# Patient Record
Sex: Female | Born: 1975
Health system: Southern US, Community
[De-identification: ages and names within clinical notes are randomized; demographics above are authoritative.]

## PROBLEM LIST (undated history)

## (undated) DIAGNOSIS — I10 Essential (primary) hypertension: Secondary | ICD-10-CM

## (undated) DIAGNOSIS — M1990 Unspecified osteoarthritis, unspecified site: Secondary | ICD-10-CM

## (undated) DIAGNOSIS — D649 Anemia, unspecified: Secondary | ICD-10-CM

## (undated) DIAGNOSIS — J189 Pneumonia, unspecified organism: Secondary | ICD-10-CM

## (undated) DIAGNOSIS — I499 Cardiac arrhythmia, unspecified: Secondary | ICD-10-CM

## (undated) DIAGNOSIS — K859 Acute pancreatitis without necrosis or infection, unspecified: Secondary | ICD-10-CM

## (undated) DIAGNOSIS — K219 Gastro-esophageal reflux disease without esophagitis: Secondary | ICD-10-CM

## (undated) DIAGNOSIS — F419 Anxiety disorder, unspecified: Secondary | ICD-10-CM

## (undated) HISTORY — PX: FRACTURE SURGERY: SHX138

## (undated) HISTORY — DX: Anxiety disorder, unspecified: F41.9

## (undated) HISTORY — PX: WISDOM TOOTH EXTRACTION: SHX21

## (undated) HISTORY — PX: BREAST ENHANCEMENT SURGERY: SHX7

## (undated) HISTORY — DX: Gastro-esophageal reflux disease without esophagitis: K21.9

---

## 2009-11-04 ENCOUNTER — Emergency Department: Payer: Self-pay | Admitting: Emergency Medicine

## 2016-10-09 DIAGNOSIS — Z13228 Encounter for screening for other metabolic disorders: Secondary | ICD-10-CM | POA: Diagnosis not present

## 2016-10-09 DIAGNOSIS — Z131 Encounter for screening for diabetes mellitus: Secondary | ICD-10-CM | POA: Diagnosis not present

## 2016-10-09 DIAGNOSIS — Z13220 Encounter for screening for lipoid disorders: Secondary | ICD-10-CM | POA: Diagnosis not present

## 2016-10-09 DIAGNOSIS — Z01419 Encounter for gynecological examination (general) (routine) without abnormal findings: Secondary | ICD-10-CM | POA: Diagnosis not present

## 2016-10-09 DIAGNOSIS — Z6822 Body mass index (BMI) 22.0-22.9, adult: Secondary | ICD-10-CM | POA: Diagnosis not present

## 2017-03-05 DIAGNOSIS — M4012 Other secondary kyphosis, cervical region: Secondary | ICD-10-CM | POA: Diagnosis not present

## 2017-03-05 DIAGNOSIS — M50322 Other cervical disc degeneration at C5-C6 level: Secondary | ICD-10-CM | POA: Diagnosis not present

## 2017-03-05 DIAGNOSIS — M9902 Segmental and somatic dysfunction of thoracic region: Secondary | ICD-10-CM | POA: Diagnosis not present

## 2017-03-05 DIAGNOSIS — M9901 Segmental and somatic dysfunction of cervical region: Secondary | ICD-10-CM | POA: Diagnosis not present

## 2017-03-18 DIAGNOSIS — D225 Melanocytic nevi of trunk: Secondary | ICD-10-CM | POA: Diagnosis not present

## 2017-03-18 DIAGNOSIS — L821 Other seborrheic keratosis: Secondary | ICD-10-CM | POA: Diagnosis not present

## 2017-03-18 DIAGNOSIS — D485 Neoplasm of uncertain behavior of skin: Secondary | ICD-10-CM | POA: Diagnosis not present

## 2017-10-23 DIAGNOSIS — Z01419 Encounter for gynecological examination (general) (routine) without abnormal findings: Secondary | ICD-10-CM | POA: Diagnosis not present

## 2017-10-23 DIAGNOSIS — Z1231 Encounter for screening mammogram for malignant neoplasm of breast: Secondary | ICD-10-CM | POA: Diagnosis not present

## 2018-06-23 DIAGNOSIS — D225 Melanocytic nevi of trunk: Secondary | ICD-10-CM | POA: Diagnosis not present

## 2018-06-23 DIAGNOSIS — D485 Neoplasm of uncertain behavior of skin: Secondary | ICD-10-CM | POA: Diagnosis not present

## 2018-06-23 DIAGNOSIS — L821 Other seborrheic keratosis: Secondary | ICD-10-CM | POA: Diagnosis not present

## 2018-09-10 DIAGNOSIS — H524 Presbyopia: Secondary | ICD-10-CM | POA: Diagnosis not present

## 2018-09-17 ENCOUNTER — Ambulatory Visit: Payer: Self-pay | Admitting: Family Medicine

## 2018-09-17 ENCOUNTER — Encounter: Payer: Self-pay | Admitting: Family Medicine

## 2018-09-17 ENCOUNTER — Ambulatory Visit: Payer: BLUE CROSS/BLUE SHIELD | Admitting: Family Medicine

## 2018-09-17 VITALS — BP 112/62 | HR 81 | Temp 97.7°F | Ht 64.5 in | Wt 127.8 lb

## 2018-09-17 DIAGNOSIS — Z720 Tobacco use: Secondary | ICD-10-CM

## 2018-09-17 DIAGNOSIS — R002 Palpitations: Secondary | ICD-10-CM

## 2018-09-17 DIAGNOSIS — Z8249 Family history of ischemic heart disease and other diseases of the circulatory system: Secondary | ICD-10-CM | POA: Diagnosis not present

## 2018-09-17 DIAGNOSIS — R7989 Other specified abnormal findings of blood chemistry: Secondary | ICD-10-CM

## 2018-09-17 DIAGNOSIS — R945 Abnormal results of liver function studies: Secondary | ICD-10-CM

## 2018-09-17 DIAGNOSIS — Z23 Encounter for immunization: Secondary | ICD-10-CM

## 2018-09-17 DIAGNOSIS — Z1239 Encounter for other screening for malignant neoplasm of breast: Secondary | ICD-10-CM | POA: Diagnosis not present

## 2018-09-17 DIAGNOSIS — F419 Anxiety disorder, unspecified: Secondary | ICD-10-CM | POA: Insufficient documentation

## 2018-09-17 LAB — LIPID PANEL
Cholesterol: 226 mg/dL — ABNORMAL HIGH (ref 0–200)
HDL: 104.4 mg/dL (ref >39.00)
LDL Cholesterol: 102 mg/dL — ABNORMAL HIGH (ref 0–99)
NonHDL: 121.65
Total CHOL/HDL Ratio: 2
Triglycerides: 98 mg/dL (ref 0.0–149.0)
VLDL: 19.6 mg/dL (ref 0.0–40.0)

## 2018-09-17 LAB — CBC WITH DIFFERENTIAL/PLATELET
Basophils Absolute: 0 10*3/uL (ref 0.0–0.1)
Basophils Relative: 0.7 % (ref 0.0–3.0)
Eosinophils Absolute: 0.1 10*3/uL (ref 0.0–0.7)
Eosinophils Relative: 2.1 % (ref 0.0–5.0)
HCT: 42.5 % (ref 36.0–46.0)
Hemoglobin: 14.6 g/dL (ref 12.0–15.0)
Lymphocytes Relative: 48.5 % — ABNORMAL HIGH (ref 12.0–46.0)
Lymphs Abs: 2.9 10*3/uL (ref 0.7–4.0)
MCHC: 34.5 g/dL (ref 30.0–36.0)
MCV: 104.9 fl — ABNORMAL HIGH (ref 78.0–100.0)
Monocytes Absolute: 0.5 10*3/uL (ref 0.1–1.0)
Monocytes Relative: 8 % (ref 3.0–12.0)
Neutro Abs: 2.4 10*3/uL (ref 1.4–7.7)
Neutrophils Relative %: 40.7 % — ABNORMAL LOW (ref 43.0–77.0)
Platelets: 154 10*3/uL (ref 150.0–400.0)
RBC: 4.05 Mil/uL (ref 3.87–5.11)
RDW: 13.4 % (ref 11.5–15.5)
WBC: 6 10*3/uL (ref 4.0–10.5)

## 2018-09-17 LAB — COMPREHENSIVE METABOLIC PANEL
ALT: 208 U/L — ABNORMAL HIGH (ref 0–35)
AST: 304 U/L — ABNORMAL HIGH (ref 0–37)
Albumin: 4.9 g/dL (ref 3.5–5.2)
Alkaline Phosphatase: 102 U/L (ref 39–117)
BUN: 9 mg/dL (ref 6–23)
CO2: 30 mEq/L (ref 19–32)
Calcium: 10 mg/dL (ref 8.4–10.5)
Chloride: 101 mEq/L (ref 96–112)
Creatinine, Ser: 0.72 mg/dL (ref 0.40–1.20)
GFR: 93.51 mL/min (ref >60.00)
Glucose, Bld: 97 mg/dL (ref 70–99)
Potassium: 3.8 mEq/L (ref 3.5–5.1)
Sodium: 140 mEq/L (ref 135–145)
Total Bilirubin: 0.5 mg/dL (ref 0.2–1.2)
Total Protein: 7.6 g/dL (ref 6.0–8.3)

## 2018-09-17 LAB — TSH: TSH: 3.24 u[IU]/mL (ref 0.35–4.50)

## 2018-09-17 MED ORDER — ALPRAZOLAM 0.25 MG PO TABS: 0.2500 mg | ORAL_TABLET | Freq: Two times a day (BID) | ORAL | 1 refills | Status: DC | PRN

## 2018-09-17 MED ORDER — BUPROPION HCL ER (SR) 150 MG PO TB12: 150.0000 mg | ORAL_TABLET | Freq: Two times a day (BID) | ORAL | 2 refills | Status: DC

## 2018-09-17 NOTE — Patient Instructions (Addendum)
Please call and schedule an appointment for screening mammogram.  [phone]  Please stop at the front desk to see one of the referrals coordinators  Bupropion sustained-release tablets (smoking cessation) What is this medicine? BUPROPION (byoo PROE pee on) is used to help people quit smoking. This medicine may be used for other purposes; ask your health care provider or pharmacist if you have questions. COMMON BRAND NAME(S): Buproban, Zyban What should I tell my health care provider before I take this medicine? They need to know if you have any of these conditions: -an eating disorder, such as anorexia or bulimia -bipolar disorder or psychosis -diabetes or high blood sugar, treated with medication -glaucoma -head injury or brain tumor -heart disease, previous heart attack, or irregular heart beat -high blood pressure -kidney or liver disease -seizures -suicidal thoughts or a previous suicide attempt -Tourette's syndrome -weight loss -an unusual or allergic reaction to bupropion, other medicines, foods, dyes, or preservatives -breast-feeding -pregnant or trying to become pregnant How should I use this medicine? Take this medicine by mouth with a glass of water. Follow the directions on the prescription label. You can take it with or without food. If it upsets your stomach, take it with food. Do not cut, crush or chew this medicine. Take your medicine at regular intervals. If you take this medicine more than once a day, take your second dose at least 8 hours after you take your first dose. To limit difficulty in sleeping, avoid taking this medicine at bedtime. Do not take your medicine more often than directed. Do not stop taking this medicine suddenly except upon the advice of your doctor. Stopping this medicine too quickly may cause serious side effects. A special MedGuide will be given to you by the pharmacist with each prescription and refill. Be  sure to read this information carefully each time. Talk to your pediatrician regarding the use of this medicine in children. Special care may be needed. Overdosage: If you think you have taken too much of this medicine contact a poison control center or emergency room at once. NOTE: This medicine is only for you. Do not share this medicine with others. What if I miss a dose? If you miss a dose, skip the missed dose and take your next tablet at the regular time. There should be at least 8 hours between doses. Do not take double or extra doses. What may interact with this medicine? Do not take this medicine with any of the following medications: -linezolid -MAOIs like Azilect, Carbex, Eldepryl, Marplan, Nardil, and Parnate -methylene blue (injected into a vein) -other medicines that contain bupropion like Wellbutrin This medicine may also interact with the following medications: -alcohol -certain medicines for anxiety or sleep -certain medicines for blood pressure like metoprolol, propranolol -certain medicines for depression or psychotic disturbances -certain medicines for HIV or AIDS like efavirenz, lopinavir, nelfinavir, ritonavir -certain medicines for irregular heart beat like propafenone, flecainide -certain medicines for Parkinson's disease like amantadine, levodopa -certain medicines for seizures like carbamazepine, phenytoin, phenobarbital -cimetidine -clopidogrel -cyclophosphamide -digoxin -furazolidone -isoniazid -nicotine -orphenadrine -procarbazine -steroid medicines like prednisone or cortisone -stimulant medicines for attention disorders, weight loss, or to stay awake -tamoxifen -theophylline -thiotepa -ticlopidine -tramadol -warfarin This list may not describe all possible interactions. Give your health care provider a list of all the medicines, herbs, non-prescription drugs, or dietary supplements you use. Also tell them if you smoke, drink alcohol, or use illegal  drugs. Some items may interact with your medicine.  What should I watch for while using this medicine? Visit your doctor or health care professional for regular checks on your progress. This medicine should be used together with a patient support program. It is important to participate in a behavioral program, counseling, or other support program that is recommended by your health care professional. Patients and their families should watch out for new or worsening thoughts of suicide or depression. Also watch out for sudden changes in feelings such as feeling anxious, agitated, panicky, irritable, hostile, aggressive, impulsive, severely restless, overly excited and hyperactive, or not being able to sleep. If this happens, especially at the beginning of treatment or after a change in dose, call your health care professional. Avoid alcoholic drinks while taking this medicine. Drinking excessive alcoholic beverages, using sleeping or anxiety medicines, or quickly stopping the use of these agents while taking this medicine may increase your risk for a seizure. Do not drive or use heavy machinery until you know how this medicine affects you. This medicine can impair your ability to perform these tasks. Do not take this medicine close to bedtime. It may prevent you from sleeping. Your mouth may get dry. Chewing sugarless gum or sucking hard candy, and drinking plenty of water may help. Contact your doctor if the problem does not go away or is severe. Do not use nicotine patches or chewing gum without the advice of your doctor or health care professional while taking this medicine. You may need to have your blood pressure taken regularly if your doctor recommends that you use both nicotine and this medicine together. What side effects may I notice from receiving this medicine? Side effects that you should report to your doctor or health care professional as soon as possible: -allergic reactions like skin rash,  itching or hives, swelling of the face, lips, or tongue -breathing problems -changes in vision -confusion -elevated mood, decreased need for sleep, racing thoughts, impulsive behavior -fast or irregular heartbeat -hallucinations, loss of contact with reality -increased blood pressure -redness, blistering, peeling or loosening of the skin, including inside the mouth -seizures -suicidal thoughts or other mood changes -unusually weak or tired -vomiting Side effects that usually do not require medical attention (report to your doctor or health care professional if they continue or are bothersome): -constipation -headache -loss of appetite -nausea -tremors -weight loss This list may not describe all possible side effects. Call your doctor for medical advice about side effects. You may report side effects to FDA at 1-800-FDA-1088. Where should I keep my medicine? Keep out of the reach of children. Store at room temperature between 20 and 25 degrees C (68 and 77 degrees F). Protect from light. Keep container tightly closed. Throw away any unused medicine after the expiration date. NOTE: This sheet is a summary. It may not cover all possible information. If you have questions about this medicine, talk to your doctor, pharmacist, or health care provider.  2018 Elsevier/Gold Standard (2016-04-13 13:49:28)

## 2018-09-17 NOTE — Progress Notes (Signed)
Subjective:    Patient ID: Olivia Turner, female    DOB: 04/11/1976, 42 y.o.   MRN: [id number]  HPI This is a 42 yo female who presents today to establish care. She is accompanied by a friend who is also my patient. 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   Last CPE- gyn last year Mammo- 10/23/17 Pap- 10/23/17- negative, negative HPV Tdap- 07/24/11 Flu- today Eye- regular Dental- regular Exercise- not regular  Concerned about her heart. Over last 1.5 months she has noticed heart fluttering. Occurs between 10 pm- 4 am. Feels like an elephant is sitting on her chest. Has almost every night. Has SOB. Takes her xanax and after about 2 hours, it "calms down." Never occurs at other times. Never with physical exertion.  Smoker, 1 ppd x 25 years. Strong family history of CAD in father and brother.   Chronic headaches, gets migraines, Botox helps.     Past Medical History:  Diagnosis Date  . Anxiety   . GERD (gastroesophageal reflux disease)    History reviewed. No pertinent surgical history. Family History  Problem Relation Age of Onset  . Hyperlipidemia Mother   . Heart attack Father   . Hyperlipidemia Father   . Atrial fibrillation Brother   . Heart attack Maternal Uncle    Social History   Tobacco Use  . Smoking status: Current Every Day Smoker  . Smokeless tobacco: Never Used  Substance Use Topics  . Alcohol use: Yes    Alcohol/week: 3.0 standard drinks    Types: 3 Glasses of wine per week  . Drug use: Never      Review of Systems Per HPI    Objective:   Physical Exam  Constitutional: She is oriented to person, place, and time. She appears well-developed and well-nourished. No distress.  HENT:  Head: Normocephalic and atraumatic.  Mouth/Throat: Oropharynx is clear and moist.  Eyes: Conjunctivae are normal.  Neck: Normal range of motion. Neck supple.  Cardiovascular: Normal rate, regular  rhythm and normal heart sounds.  Pulmonary/Chest: Effort normal and breath sounds normal.  Musculoskeletal: She exhibits no edema.  Lymphadenopathy:    She has no cervical adenopathy.  Neurological: She is alert and oriented to person, place, and time.  Skin: Skin is warm and dry. She is not diaphoretic.  Psychiatric: She has a normal mood and affect. Her behavior is normal. Judgment and thought content normal.  Vitals reviewed.     BP 112/62 (BP Location: Right Arm, Patient Position: Sitting, Cuff Size: Normal)   Pulse 81   Temp 97.7 F (36.5 C) (Oral)   Ht 5' 4.5" (1.638 m)   Wt 127 lb 12.8 oz (58 kg)   SpO2 97%   BMI 21.60 kg/m   EKG- rate 82, PR 130, sinus rhythm, compared to 11/04/2009 tracing, no significant change    Assessment & Plan:  1. Palpitations - Lipid Panel - Comprehensive metabolic panel - TSH - CBC with Differential - Holter monitor - 48 hour; Future - EKG 12-Lead - Ambulatory referral to Cardiology  2. Screening for breast cancer - MM 3D SCREEN BREAST BILATERAL; Future  3. Family history of early CAD - Ambulatory referral to Cardiology  4. Tobacco abuse - encouraged smoking cessation - provided her a written prescription for bupropion at her request. She will not start until after palpitations worked up  5. Need for influenza vaccination - Flu Vaccine QUAD 36+ mos IM  Olean Ree, FNP-BC  [facility], [facility]  09/18/2018 3:45 PM

## 2018-09-19 ENCOUNTER — Other Ambulatory Visit: Payer: Self-pay | Admitting: Family Medicine

## 2018-09-19 ENCOUNTER — Encounter: Payer: Self-pay | Admitting: Family Medicine

## 2018-09-19 DIAGNOSIS — R945 Abnormal results of liver function studies: Secondary | ICD-10-CM

## 2018-09-19 MED ORDER — ALPRAZOLAM 0.25 MG PO TABS: 0.2500 mg | ORAL_TABLET | Freq: Two times a day (BID) | ORAL | 1 refills | Status: DC | PRN

## 2018-09-21 DIAGNOSIS — R079 Chest pain, unspecified: Secondary | ICD-10-CM | POA: Insufficient documentation

## 2018-09-21 DIAGNOSIS — F17200 Nicotine dependence, unspecified, uncomplicated: Secondary | ICD-10-CM | POA: Insufficient documentation

## 2018-09-21 NOTE — Progress Notes (Signed)
Cardiology Office Note  Date:  09/22/2018   ID:  Olivia Turner, DOB 1975/12/12, MRN [id number]  PCP:  Olivia Belfast, FNP   Chief Complaint  Patient presents with  . other    Ref by Olivia Ree, NP for palpitations and family Hx. of early CAD. Meds reviewed by the pt. verbally. Pt. c/o chest pain at night, shortness of breath, dizziness and palpitations.     HPI:  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 Married with children, x2  Biggest complaint is heart fluttering in the middle of the night In general she will wake 10 PM to 4 AM, "not panic attacks"  Feels a tachycardia, keeps her awake Periodically will measure blood pressure, once was very high almost went to the emergency room  "BP 252/102"  Periodically feels like she has elephant sitting on her chest Seems to happen every night Often associated with some shortness of breath Takes Xanax and symptoms improve after 2 hours  She has had the symptoms for quite some time  Also felt very tired for the past  45 to 60 days  Continues to smoke 1 pack/day x 25 years  Father, died 35,  and brother with premature coronary disease  EKG personally reviewed by myself on todays visit Shows normal sinus rhythm with rate 95 bpm no significant ST or T wave changes   PMH:   has a past medical history of Anxiety and GERD (gastroesophageal reflux disease).  PSH:    Past Surgical History:  Procedure Laterality Date  . BREAST ENHANCEMENT SURGERY      Current Outpatient Medications  Medication Sig Dispense Refill  . ALPRAZolam (XANAX) 0.25 MG tablet Take 1 tablet (0.25 mg total) by mouth 2 (two)  times daily as needed for anxiety. 30 tablet 1  . aspirin 81 MG chewable tablet Chew 81 mg by mouth daily.    . Cetirizine HCl (ZYRTEC ALLERGY) 10 MG CAPS Take 10 mg by mouth daily.    . Cyanocobalamin (VITAMIN B-12 IJ) Inject as directed once a week.    Marland Kitchen FLAXSEED, LINSEED, PO flaxseed    . levonorgestrel (MIRENA, 52 MG,) 20 MCG/24HR IUD Mirena 20 mcg/24 hours (5 yrs) 52 mg intrauterine device  Take by intrauterine route.    . Multiple Vitamin (MULTI-VITAMIN DAILY PO) Multi Vitamin    . buPROPion (WELLBUTRIN SR) 150 MG 12 hr tablet Take 1 tablet (150 mg total) by mouth 2 (two) times daily. To start, 1 tablet daily for 3 days then take 1 tablet twice a day. Do not take later than 6 pm. (Patient not taking: Reported on 09/22/2018) 60 tablet 2   No current facility-administered medications for this visit.      Allergies:   Cephalosporins; Penicillins; and Sulfa antibiotics   Social History:  The patient  reports that she has been smoking cigarettes. She has a 20.00 pack-year smoking history. She has never used smokeless tobacco. She reports that she drinks about 3.0 standard drinks of alcohol per week. She reports that she does not use drugs.   Family History:   family history includes Atrial fibrillation in her brother; Heart attack (age of onset: 56) in her father; Heart attack (age  of onset: 445) in her maternal uncle; Hyperlipidemia in her father and mother; Mitral valve prolapse in her maternal aunt.    Review of Systems: Review of Systems  Constitutional: Negative.   Respiratory: Negative.   Cardiovascular: Negative.   Gastrointestinal: Negative.   Musculoskeletal: Negative.   Neurological: Negative.   Psychiatric/Behavioral: The patient is nervous/anxious.   All other systems reviewed and are negative.    PHYSICAL EXAM: VS:  BP 110/76 (BP Location: Right Arm, Patient Position: Sitting, Cuff Size: Normal)   Pulse 95   Ht 5' 4.5" (1.638 m)   Wt 127 lb 8 oz (57.8 kg)   BMI  21.55 kg/m  , BMI Body mass index is 21.55 kg/m. GEN: Well nourished, well developed, in no acute distress  HEENT: normal  Neck: no JVD, carotid bruits, or masses Cardiac: RRR; no murmurs, rubs, or gallops,no edema  Respiratory:  clear to auscultation bilaterally, normal work of breathing GI: soft, nontender, nondistended, + BS MS: no deformity or atrophy  Skin: warm and dry, no rash Neuro:  Strength and sensation are intact Psych: euthymic mood, full affect, very anxious  Recent Labs: 09/17/2018: ALT 208; BUN 9; Creatinine, Ser 0.72; Hemoglobin 14.6; Platelets 154.0; Potassium 3.8; Sodium 140; TSH 3.24    Lipid Panel Lab Results  Component Value Date   CHOL 226 (H) 09/17/2018   HDL 104.40 09/17/2018   LDLCALC 102 (H) 09/17/2018   TRIG 98.0 09/17/2018      Wt Readings from Last 3 Encounters:  09/22/18 127 lb 8 oz (57.8 kg)  09/17/18 127 lb 12.8 oz (58 kg)       ASSESSMENT AND PLAN:  Paroxysmal tachycardia (HCC) Etiology of her tachycardia at nighttime is unclear, recommended a long-term Holter monitor We have ordered a ZIO Patch, Described the mechanism to her Possible atrial tachycardia though unable to exclude other arrhythmia  Chest pain, unspecified type -  Atypical chest tightness in the middle of the night, suspect secondary to anxiety We have ordered CT coronary calcium scoring for risk stratification She is otherwise very active mother, takes care of 2 children, busy lifestyle No symptoms on exertion  Smoker She does not want Chantix Discussed other modalities for smoking cessation More than 5 minutes spent discussing  Moderate anxiety Major issue, very anxious in the room today Suspect anxiety playing a role in her symptoms at nighttime Will defer to primary care  Disposition:   Recommend we call her with the results of her CT cardiac calcium scoring and monitor  Patient was seen in consultation for Olivia Turner and will be referred back to her  office for ongoing care of the issues detailed above   Total encounter time more than 60 minutes  Greater than 50% was spent in counseling and coordination of care with the patient    Orders Placed This Encounter  Procedures  . CT CARDIAC SCORING  . LONG TERM MONITOR (3-14 DAYS)  . EKG 12-Lead     Signed, Dossie Arbourim Danaka Llera, M.D., Ph.D. 09/22/2018  [facility] [place] [phone]

## 2018-09-22 ENCOUNTER — Ambulatory Visit (INDEPENDENT_AMBULATORY_CARE_PROVIDER_SITE_OTHER): Payer: BLUE CROSS/BLUE SHIELD

## 2018-09-22 ENCOUNTER — Ambulatory Visit: Payer: BLUE CROSS/BLUE SHIELD | Admitting: Cardiovascular Disease

## 2018-09-22 ENCOUNTER — Encounter: Payer: Self-pay | Admitting: Cardiovascular Disease

## 2018-09-22 ENCOUNTER — Other Ambulatory Visit: Payer: Self-pay | Admitting: Family Medicine

## 2018-09-22 VITALS — BP 110/76 | HR 95 | Ht 64.5 in | Wt 127.5 lb

## 2018-09-22 DIAGNOSIS — R079 Chest pain, unspecified: Secondary | ICD-10-CM

## 2018-09-22 DIAGNOSIS — F17200 Nicotine dependence, unspecified, uncomplicated: Secondary | ICD-10-CM | POA: Diagnosis not present

## 2018-09-22 DIAGNOSIS — F419 Anxiety disorder, unspecified: Secondary | ICD-10-CM | POA: Diagnosis not present

## 2018-09-22 DIAGNOSIS — I479 Paroxysmal tachycardia, unspecified: Secondary | ICD-10-CM | POA: Diagnosis not present

## 2018-09-22 MED ORDER — ALPRAZOLAM 0.25 MG PO TABS: 0.2500 mg | ORAL_TABLET | Freq: Two times a day (BID) | ORAL | 1 refills | Status: DC | PRN

## 2018-09-22 NOTE — Patient Instructions (Addendum)
Medication Instructions:  No changes  If you need a refill on your cardiac medications before your next appointment, please call your pharmacy.    Lab work: No new labs needed   If you have labs (blood work) drawn today and your tests are completely normal, you will receive your results only by: Marland Kitchen. MyChart Message (if you have MyChart) OR . A paper copy in the mail If you have any lab test that is abnormal or we need to change your treatment, we will call you to review the results.   Testing/Procedures: We will place a Zio monitor for fluttering, arrhythmia, tachycardia after your CT.  We will order CT coronary calcium score $150 Family History     Please call [phone] to schedule   [facility] [address] [place], [place] [address]   Follow-Up: At [facility], you and your health needs are our priority.  As part of our continuing mission to provide you with exceptional heart care, we have created designated Provider Care Teams.  These Care Teams include your primary Cardiologist (physician) and Advanced Practice Providers (APPs -  Physician Assistants and Nurse Practitioners) who all work together to provide you with the care you need, when you need it.   We will call you with the results  . Providers on your designated Care Team:   . Olivia Duckinghristopher Berge, NP . Olivia Listenyan Dunn, PA-C . Olivia IvanJacquelyn Visser, PA-C  Any Other Special Instructions Will Be Listed Below (If Applicable).  For educational health videos Log in to : [email or web address] Or : [email or web address], password : [email or web address]

## 2018-09-27 ENCOUNTER — Emergency Department
Admission: EM | Admit: 2018-09-27 | Discharge: 2018-09-27 | Discharge status: Against Medical Advice | Payer: BLUE CROSS/BLUE SHIELD | Attending: Emergency Medicine | Admitting: Emergency Medicine

## 2018-09-27 ENCOUNTER — Emergency Department: Payer: BLUE CROSS/BLUE SHIELD

## 2018-09-27 ENCOUNTER — Other Ambulatory Visit: Payer: Self-pay

## 2018-09-27 DIAGNOSIS — Z5329 Procedure and treatment not carried out because of patient's decision for other reasons: Secondary | ICD-10-CM | POA: Diagnosis not present

## 2018-09-27 DIAGNOSIS — Z79899 Other long term (current) drug therapy: Secondary | ICD-10-CM | POA: Insufficient documentation

## 2018-09-27 DIAGNOSIS — R7401 Elevation of levels of liver transaminase levels: Secondary | ICD-10-CM

## 2018-09-27 DIAGNOSIS — Y908 Blood alcohol level of 240 mg/100 ml or more: Secondary | ICD-10-CM | POA: Insufficient documentation

## 2018-09-27 DIAGNOSIS — R1010 Upper abdominal pain, unspecified: Secondary | ICD-10-CM | POA: Diagnosis present

## 2018-09-27 DIAGNOSIS — F17210 Nicotine dependence, cigarettes, uncomplicated: Secondary | ICD-10-CM | POA: Insufficient documentation

## 2018-09-27 DIAGNOSIS — R740 Nonspecific elevation of levels of transaminase and lactic acid dehydrogenase [LDH]: Secondary | ICD-10-CM | POA: Insufficient documentation

## 2018-09-27 DIAGNOSIS — Z7982 Long term (current) use of aspirin: Secondary | ICD-10-CM | POA: Diagnosis not present

## 2018-09-27 DIAGNOSIS — F1099 Alcohol use, unspecified with unspecified alcohol-induced disorder: Secondary | ICD-10-CM | POA: Insufficient documentation

## 2018-09-27 DIAGNOSIS — K852 Alcohol induced acute pancreatitis without necrosis or infection: Secondary | ICD-10-CM | POA: Diagnosis not present

## 2018-09-27 LAB — URINALYSIS, COMPLETE (UACMP) WITH MICROSCOPIC
Bacteria, UA: NONE SEEN
Bilirubin Urine: NEGATIVE
Glucose, UA: NEGATIVE mg/dL
Hgb urine dipstick: NEGATIVE
Ketones, ur: NEGATIVE mg/dL
Leukocytes, UA: NEGATIVE
Nitrite: NEGATIVE
Protein, ur: NEGATIVE mg/dL
Specific Gravity, Urine: 1.003 — ABNORMAL LOW (ref 1.005–1.030)
pH: 6 (ref 5.0–8.0)

## 2018-09-27 LAB — COMPREHENSIVE METABOLIC PANEL
ALT: 185 U/L — ABNORMAL HIGH (ref 0–44)
AST: 267 U/L — ABNORMAL HIGH (ref 15–41)
Albumin: 4.7 g/dL (ref 3.5–5.0)
Alkaline Phosphatase: 94 U/L (ref 38–126)
Anion gap: 10 (ref 5–15)
BUN: 10 mg/dL (ref 6–20)
CO2: 27 mmol/L (ref 22–32)
Calcium: 8.8 mg/dL — ABNORMAL LOW (ref 8.9–10.3)
Chloride: 102 mmol/L (ref 98–111)
Creatinine, Ser: 0.51 mg/dL (ref 0.44–1.00)
GFR calc Af Amer: >60 mL/min (ref >60)
GFR calc non Af Amer: >60 mL/min (ref >60)
Glucose, Bld: 101 mg/dL — ABNORMAL HIGH (ref 70–99)
Potassium: 3.7 mmol/L (ref 3.5–5.1)
Sodium: 139 mmol/L (ref 135–145)
Total Bilirubin: 0.7 mg/dL (ref 0.3–1.2)
Total Protein: 7 g/dL (ref 6.5–8.1)

## 2018-09-27 LAB — CBC
HCT: 39.7 % (ref 36.0–46.0)
Hemoglobin: 13.3 g/dL (ref 12.0–15.0)
MCH: 35.1 pg — ABNORMAL HIGH (ref 26.0–34.0)
MCHC: 33.5 g/dL (ref 30.0–36.0)
MCV: 104.7 fL — ABNORMAL HIGH (ref 80.0–100.0)
Platelets: 157 10*3/uL (ref 150–400)
RBC: 3.79 MIL/uL — ABNORMAL LOW (ref 3.87–5.11)
RDW: 12.7 % (ref 11.5–15.5)
WBC: 7.9 10*3/uL (ref 4.0–10.5)
nRBC: 0 % (ref 0.0–0.2)

## 2018-09-27 LAB — LIPASE, BLOOD: Lipase: 314 U/L — ABNORMAL HIGH (ref 11–51)

## 2018-09-27 LAB — ACETAMINOPHEN LEVEL: Acetaminophen (Tylenol), Serum: <10 ug/mL — ABNORMAL LOW (ref 10–30)

## 2018-09-27 LAB — ETHANOL: Alcohol, Ethyl (B): 329 mg/dL (ref <10)

## 2018-09-27 MED ORDER — IOHEXOL 300 MG/ML  SOLN: 75.0000 mL | Freq: Once | INTRAMUSCULAR | Status: DC | PRN

## 2018-09-27 NOTE — ED Notes (Addendum)
Patient has "friend" with her who claims to be a Publishing rights managernurse practitioner. "Friend" is obviously impaired with slurred speech, dilated pupils and continues to interrupt the conversation with the patient stating she "had to drag her to the ER kicking and screaming." Patient initially wanted to leave but was encouraged to stay and did however eloped from the room and stopped at the front desk to say that "we didn't offer her a Tylenol" for her pain. Given the patient's lab report, we in fact did not offer Tylenol. Strong odor of alcohol noted on the patient as well as the "friend."

## 2018-09-27 NOTE — ED Notes (Signed)
PT REPORTS TO THIS NURSE ,"I DRINK ETOH ABOUT EVERY OTHER DAY AND TAKE 4-8 TABS OF TYLENOL 500MG  A DAY , FOR YEARS

## 2018-09-27 NOTE — ED Notes (Signed)
Pt and visitors up to STAT desk telling registration and this RN that she "was not even offered tylenol" for her pain. Pt went on in the same way x 5 minutes until she and visitors left angrily out of ED. Primary Nurse Jonny RuizJohn made aware that pt AMA'd from room.
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
## 2018-09-27 NOTE — ED Notes (Signed)
PT TO ULTRASOUND AT THIS TIME

## 2018-09-27 NOTE — ED Triage Notes (Signed)
Pt c/o mid abdominal pain that started today at 12PM. Denies NVD. Reports pain, as constant and sharp. Pt alert and oriented X4, active, cooperative, pt in NAD. RR even and unlabored, color WNL.

## 2018-09-27 NOTE — ED Notes (Signed)
Pt eloped with iv in place , charge rn made aware

## 2018-09-27 NOTE — ED Notes (Signed)
POC PREG NEG 

## 2018-10-01 ENCOUNTER — Telehealth: Payer: Self-pay | Admitting: Family Medicine

## 2018-10-01 DIAGNOSIS — K852 Alcohol induced acute pancreatitis without necrosis or infection: Secondary | ICD-10-CM

## 2018-10-01 NOTE — Telephone Encounter (Signed)
Called and spoke with patient regarding her recent ER visit for alcohol induced pancreatitis.  She reports that she has had no further abdominal pain, no diarrhea, no vomiting.  She has not had any alcoholic beverages.  I discussed need for her to follow-up with blood work at the beginning of next week.  She agreed to call when she gets back into town from [place] to schedule lab only appointment.  Reviewed return to clinic in ER precautions.  Encouraged her to abstain from alcoholic beverages and avoid any foods that increase abdominal pain such as heavy, fried, fatty foods.  Lab orders are in.

## 2018-10-10 ENCOUNTER — Other Ambulatory Visit (INDEPENDENT_AMBULATORY_CARE_PROVIDER_SITE_OTHER): Payer: BLUE CROSS/BLUE SHIELD

## 2018-10-10 DIAGNOSIS — R945 Abnormal results of liver function studies: Secondary | ICD-10-CM

## 2018-10-10 DIAGNOSIS — K852 Alcohol induced acute pancreatitis without necrosis or infection: Secondary | ICD-10-CM

## 2018-10-10 LAB — HEPATIC FUNCTION PANEL
ALT: 39 U/L — ABNORMAL HIGH (ref 0–35)
AST: 35 U/L (ref 0–37)
Albumin: 4.1 g/dL (ref 3.5–5.2)
Alkaline Phosphatase: 64 U/L (ref 39–117)
Bilirubin, Direct: 0.1 mg/dL (ref 0.0–0.3)
Total Bilirubin: 0.5 mg/dL (ref 0.2–1.2)
Total Protein: 6.8 g/dL (ref 6.0–8.3)

## 2018-10-10 LAB — AMYLASE: Amylase: 45 U/L (ref 27–131)

## 2018-10-11 LAB — HEPATITIS C ANTIBODY
Hepatitis C Ab: NONREACTIVE
SIGNAL TO CUT-OFF: 0.01 (ref <1.00)

## 2018-10-11 LAB — HEPATITIS B SURFACE ANTIBODY, QUANTITATIVE: Hepatitis B-Post: >1000 m[IU]/mL (ref >=10)

## 2018-10-11 LAB — HEPATITIS B SURFACE ANTIGEN: Hepatitis B Surface Ag: NONREACTIVE

## 2018-10-11 LAB — HEPATITIS B CORE ANTIBODY, IGM: Hep B C IgM: NONREACTIVE

## 2018-10-14 ENCOUNTER — Ambulatory Visit (INDEPENDENT_AMBULATORY_CARE_PROVIDER_SITE_OTHER)
Admission: RE | Admit: 2018-10-14 | Discharge: 2018-10-14 | Disposition: A | Discharge status: Home / Self Care | Payer: Self-pay | Source: Ambulatory Visit | Attending: Cardiovascular Disease | Admitting: Cardiovascular Disease

## 2018-10-14 DIAGNOSIS — R079 Chest pain, unspecified: Secondary | ICD-10-CM

## 2018-10-17 DIAGNOSIS — R002 Palpitations: Secondary | ICD-10-CM | POA: Diagnosis not present

## 2018-10-20 ENCOUNTER — Telehealth: Payer: Self-pay

## 2018-10-20 NOTE — Telephone Encounter (Signed)
Call to patient,   "Notes recorded by Antonieta IbaGollan, Timothy J, MD on 10/19/2018 at 3:02 PM EST Event Monitor no significant arrhythmia that would explain her symptoms Patient triggered events did not correlate with significant arrhythmia  Normal sinus rhythm Avg HR of 87 bpm.   2 Supraventricular Tachycardia runs occurred,  The run with the fastest interval lasting 8 beats with a max rate of 169 bpm,  The longest lasting 10 beats with an avg rate of 149 bpm.   Isolated SVEs were rare (<1.0%), and no SVE Couplets or SVE Triplets were present. Isolated VEs were rare (<1.0%), and no VE Couplets or VE Triplets were present."  Pt verbalized understanding.  Pt asking about Stress test that is scheduled for 10/22/18, she is wanting to confirm that test has been cancelled, as she is not going to make it d/t sons schedule.

## 2018-10-21 NOTE — Telephone Encounter (Signed)
No stress test was ordered or scheduled. She had CT scoring test which she may have seen online. Reviewed results with patient and she had no further questions

## 2018-11-04 ENCOUNTER — Telehealth: Payer: Self-pay | Admitting: Cardiovascular Disease

## 2018-11-04 NOTE — Telephone Encounter (Signed)
Patient returning my call for information regarding forms she dropped off. She states that her mother had planned a trip overseas around the time she was referred to see us here in our office and they canceled their trip. She needs form completed if possible to get refund for the $5,000.00 trip. Will review with provider and let her know the outcome.

## 2018-11-04 NOTE — Telephone Encounter (Signed)
Left voicemail message to call back regarding forms. "Save" bin above [employer] desk.

## 2018-11-04 NOTE — Telephone Encounter (Signed)
Patient dropped off forms to be [facility]

## 2018-12-25 ENCOUNTER — Encounter: Payer: Self-pay | Admitting: Family Medicine

## 2018-12-26 ENCOUNTER — Encounter: Payer: Self-pay | Admitting: Family Medicine

## 2018-12-26 NOTE — Telephone Encounter (Signed)
Form placed in sign bin/folder

## 2018-12-26 NOTE — Telephone Encounter (Signed)
Form received - will work on this and give to [place] today

## 2018-12-26 NOTE — Telephone Encounter (Signed)
Pt's husband dropped off form. Placed in RX tower.

## 2018-12-29 NOTE — Telephone Encounter (Signed)
Left message asking pt to call office   Please transfer to Fredrich Romans unable to fill out paperwork.  Established care with Debbie on 09/17/18 Paperwork trip departure 09/03/2018 prior to being a patent of Debbie's

## 2018-12-30 NOTE — Telephone Encounter (Signed)
Spoke with pt She is aware Debbie cannot fill out paperwork.  Pt established 09/17/2018 and trip was 10/19

## 2018-12-30 NOTE — Telephone Encounter (Signed)
Patient was contacted by clerical staff and notified that paperwork could not be completed by me because her mother's trip was prior to her establishing care with me.

## 2019-01-11 ENCOUNTER — Encounter (HOSPITAL_COMMUNITY): Payer: Self-pay | Admitting: Emergency Medicine

## 2019-01-11 ENCOUNTER — Emergency Department (HOSPITAL_COMMUNITY): Payer: BLUE CROSS/BLUE SHIELD

## 2019-01-11 ENCOUNTER — Emergency Department (HOSPITAL_COMMUNITY)
Admission: EM | Admit: 2019-01-11 | Discharge: 2019-01-11 | Disposition: A | Discharge status: Home / Self Care | Payer: BLUE CROSS/BLUE SHIELD | Attending: Emergency Medicine | Admitting: Emergency Medicine

## 2019-01-11 ENCOUNTER — Other Ambulatory Visit: Payer: Self-pay

## 2019-01-11 DIAGNOSIS — Z79899 Other long term (current) drug therapy: Secondary | ICD-10-CM | POA: Diagnosis not present

## 2019-01-11 DIAGNOSIS — R0602 Shortness of breath: Secondary | ICD-10-CM | POA: Diagnosis not present

## 2019-01-11 DIAGNOSIS — F17210 Nicotine dependence, cigarettes, uncomplicated: Secondary | ICD-10-CM | POA: Insufficient documentation

## 2019-01-11 DIAGNOSIS — R079 Chest pain, unspecified: Secondary | ICD-10-CM | POA: Insufficient documentation

## 2019-01-11 DIAGNOSIS — K859 Acute pancreatitis without necrosis or infection, unspecified: Secondary | ICD-10-CM | POA: Diagnosis not present

## 2019-01-11 DIAGNOSIS — R0789 Other chest pain: Secondary | ICD-10-CM | POA: Diagnosis not present

## 2019-01-11 DIAGNOSIS — Z7982 Long term (current) use of aspirin: Secondary | ICD-10-CM | POA: Insufficient documentation

## 2019-01-11 DIAGNOSIS — R9431 Abnormal electrocardiogram [ECG] [EKG]: Secondary | ICD-10-CM | POA: Diagnosis not present

## 2019-01-11 DIAGNOSIS — R1084 Generalized abdominal pain: Secondary | ICD-10-CM | POA: Diagnosis not present

## 2019-01-11 LAB — CBC WITH DIFFERENTIAL/PLATELET
Abs Immature Granulocytes: 0.06 10*3/uL (ref 0.00–0.07)
Basophils Absolute: 0 10*3/uL (ref 0.0–0.1)
Basophils Relative: 0 %
Eosinophils Absolute: 0.1 10*3/uL (ref 0.0–0.5)
Eosinophils Relative: 1 %
HCT: 42.1 % (ref 36.0–46.0)
Hemoglobin: 13.9 g/dL (ref 12.0–15.0)
Immature Granulocytes: 1 %
Lymphocytes Relative: 18 %
Lymphs Abs: 1.5 10*3/uL (ref 0.7–4.0)
MCH: 33.8 pg (ref 26.0–34.0)
MCHC: 33 g/dL (ref 30.0–36.0)
MCV: 102.4 fL — ABNORMAL HIGH (ref 80.0–100.0)
Monocytes Absolute: 0.5 10*3/uL (ref 0.1–1.0)
Monocytes Relative: 6 %
Neutro Abs: 6.1 10*3/uL (ref 1.7–7.7)
Neutrophils Relative %: 74 %
Platelets: 144 10*3/uL — ABNORMAL LOW (ref 150–400)
RBC: 4.11 MIL/uL (ref 3.87–5.11)
RDW: 12.6 % (ref 11.5–15.5)
WBC: 8.2 10*3/uL (ref 4.0–10.5)
nRBC: 0 % (ref 0.0–0.2)

## 2019-01-11 LAB — BASIC METABOLIC PANEL
Anion gap: 18 — ABNORMAL HIGH (ref 5–15)
BUN: 11 mg/dL (ref 6–20)
CO2: 18 mmol/L — ABNORMAL LOW (ref 22–32)
Calcium: 9.1 mg/dL (ref 8.9–10.3)
Chloride: 100 mmol/L (ref 98–111)
Creatinine, Ser: 0.8 mg/dL (ref 0.44–1.00)
GFR calc Af Amer: >60 mL/min (ref >60)
GFR calc non Af Amer: >60 mL/min (ref >60)
Glucose, Bld: 108 mg/dL — ABNORMAL HIGH (ref 70–99)
Potassium: 3.6 mmol/L (ref 3.5–5.1)
Sodium: 136 mmol/L (ref 135–145)

## 2019-01-11 LAB — I-STAT TROPONIN, ED: Troponin i, poc: 0 ng/mL (ref 0.00–0.08)

## 2019-01-11 LAB — I-STAT BETA HCG BLOOD, ED (MC, WL, AP ONLY): I-stat hCG, quantitative: <5 m[IU]/mL (ref <5)

## 2019-01-11 LAB — LIPASE, BLOOD: Lipase: 740 U/L — ABNORMAL HIGH (ref 11–51)

## 2019-01-11 MED ORDER — SODIUM CHLORIDE 0.9% FLUSH: 3.0000 mL | Freq: Once | INTRAVENOUS | Status: DC

## 2019-01-11 MED ORDER — SODIUM CHLORIDE 0.9 % IV BOLUS
1000.0000 mL | Freq: Once | INTRAVENOUS | Status: AC
  Administered 2019-01-11: 1000 mL via INTRAVENOUS

## 2019-01-11 MED ORDER — MORPHINE SULFATE (PF) 2 MG/ML IV SOLN
2.0000 mg | Freq: Once | INTRAVENOUS | Status: AC
  Administered 2019-01-11: 2 mg via INTRAVENOUS
  Filled 2019-01-11: qty 1

## 2019-01-11 MED ORDER — ONDANSETRON HCL 4 MG PO TABS: 4.0000 mg | ORAL_TABLET | Freq: Four times a day (QID) | ORAL | 0 refills | Status: AC

## 2019-01-11 MED ORDER — OXYCODONE-ACETAMINOPHEN 5-325 MG PO TABS: 1.0000 | ORAL_TABLET | ORAL | 0 refills | Status: DC | PRN

## 2019-01-11 MED ORDER — IOHEXOL 300 MG/ML  SOLN
100.0000 mL | Freq: Once | INTRAMUSCULAR | Status: AC | PRN
  Administered 2019-01-11: 100 mL via INTRAVENOUS

## 2019-01-11 MED ORDER — ACETAMINOPHEN 325 MG PO TABS
650.0000 mg | ORAL_TABLET | Freq: Once | ORAL | Status: AC
  Administered 2019-01-11: 650 mg via ORAL
  Filled 2019-01-11: qty 2

## 2019-01-11 NOTE — ED Provider Notes (Signed)
[facility] EMERGENCY DEPARTMENT Provider Note   CSN: [id number] Arrival date & time: 01/11/19  1033    History   Chief Complaint Chief Complaint  Patient presents with  . Chest Pain    HPI Torii Rakestraw is a 43 y.o. female.     43 y/o female with a PMH of Zaidi, alcohol induced pancreatitis presents to the ED with a chief complaint of abdominal pain and chest pain since this morning around 5 AM.  Patient describes the pain mainly throughout her whole abdomen region, reports she thought it was more so localized to the lower quadrants she is had this pain for the past 2 weeks.  She was seen by her PCP who advised her that her LFTs were elevated, she should discontinue taking any Tylenol and she has done so.  She reports she was referred to a cardiologist who evaluated her with a reassuring work-up.  Patient denies any previous visits with a similar episode like this.  According to chart patient was seen on November 29 of 2019 for alcohol induced pancreatitis, I have reviewed these records extensively seems that patient eloped at that time.  Patient also endorses several episodes of vomiting along with diarrhea last night.  She also endorses some shortness of breath.  Patient does report daily alcohol states she usually has 1 to 2 glasses of wine daily with her husband.  She denies any urinary symptoms, fever, gynecological complaints.     Past Medical History:  Diagnosis Date  . Anxiety   . GERD (gastroesophageal reflux disease)     Patient Active Problem List   Diagnosis Date Noted  . Paroxysmal tachycardia (HCC) 09/22/2018  . Chest pain 09/21/2018  . Smoker 09/21/2018  . Moderate anxiety 09/17/2018    Past Surgical History:  Procedure Laterality Date  . BREAST ENHANCEMENT SURGERY       OB History   No obstetric history on file.      Home Medications    Prior to Admission medications   Medication Sig Start Date End Date Taking? Authorizing  Provider  ALPRAZolam (XANAX) 0.25 MG tablet Take 1 tablet (0.25 mg total) by mouth 2 (two) times daily as needed for anxiety. 09/22/18  Yes Emi Belfast, FNP  aspirin 81 MG chewable tablet Chew 81 mg by mouth daily.   Yes [provider]  BIOTIN PO Take 5,000 mg by mouth at bedtime.   Yes [provider]  Cyanocobalamin (VITAMIN B-12 IJ) Inject 1,200 mg as directed once a week.    Yes [provider]  Emollient (COLLAGEN EX) Take 6,000 mg by mouth daily.   Yes [provider]  FLAXSEED, LINSEED, PO Take 1 tablet by mouth at bedtime.    Yes [provider]  levonorgestrel (MIRENA, 52 MG,) 20 MCG/24HR IUD 1 each by Intrauterine route.  06/25/16  Yes [provider]  Multiple Vitamin (MULTI-VITAMIN DAILY PO) Take 1 tablet by mouth daily.    Yes [provider]  omeprazole (PRILOSEC) 20 MG capsule Take 20 mg by mouth daily.   Yes [provider]  Probiotic Product (PROBIOTIC DAILY PO) Take 5 mLs by mouth daily. Powder to mix with water   Yes [provider]  buPROPion (WELLBUTRIN SR) 150 MG 12 hr tablet Take 1 tablet (150 mg total) by mouth 2 (two) times daily. To start, 1 tablet daily for 3 days then take 1 tablet twice a day. Do not take later than 6 pm. Patient  not taking: Reported on 09/22/2018 09/17/18   Emi Belfast, FNP  ondansetron (ZOFRAN) 4 MG tablet Take 1 tablet (4 mg total) by mouth every 6 (six) hours for 7 days. 01/11/19 01/18/19  Claude Manges, PA-C  oxyCODONE-acetaminophen (PERCOCET/ROXICET) 5-325 MG tablet Take 1 tablet by mouth every 4 (four) hours as needed for up to 10 doses for severe pain. 01/11/19   Claude Manges, PA-C    Family History Family History  Problem Relation Age of Onset  . Hyperlipidemia Mother   . Heart attack Father 40  . Hyperlipidemia Father   . Atrial fibrillation Brother   . Heart attack Maternal Uncle 45  . Mitral valve prolapse Maternal Aunt        Mitral valve  replacement     Social History Social History   Tobacco Use  . Smoking status: Current Every Day Smoker    Packs/day: 1.00    Years: 20.00    Pack years: 20.00    Types: Cigarettes  . Smokeless tobacco: Never Used  Substance Use Topics  . Alcohol use: Yes    Alcohol/week: 3.0 standard drinks    Types: 3 Glasses of wine per week  . Drug use: Never     Allergies   Penicillins; Sulfa antibiotics; and Cephalosporins   Review of Systems Review of Systems  Constitutional: Negative for chills and fever.  HENT: Negative for ear pain and sore throat.   Eyes: Negative for pain and visual disturbance.  Respiratory: Positive for shortness of breath. Negative for cough.   Cardiovascular: Positive for chest pain. Negative for palpitations.  Gastrointestinal: Positive for abdominal pain, diarrhea, nausea and vomiting.  Genitourinary: Negative for dysuria and hematuria.  Musculoskeletal: Negative for arthralgias and back pain.  Skin: Negative for color change and rash.  Neurological: Negative for seizures and syncope.  All other systems reviewed and are negative.    Physical Exam Updated Vital Signs BP 124/74   Pulse 76   Temp (!) 97.4 F (36.3 C)   Resp 17   SpO2 99%   Physical Exam Vitals signs and nursing note reviewed.  Constitutional:      General: She is not in acute distress.    Appearance: She is well-developed.  HENT:     Head: Normocephalic and atraumatic.     Mouth/Throat:     Pharynx: No oropharyngeal exudate.  Eyes:     Pupils: Pupils are equal, round, and reactive to light.  Neck:     Musculoskeletal: Normal range of motion.  Cardiovascular:     Rate and Rhythm: Regular rhythm.     Heart sounds: Normal heart sounds.  Pulmonary:     Effort: Pulmonary effort is normal. No respiratory distress.     Breath sounds: Normal breath sounds.  Abdominal:     General: Bowel sounds are normal. There is no distension.     Palpations: Abdomen is soft.      Tenderness: There is abdominal tenderness in the epigastric area. There is guarding. There is no right CVA tenderness, left CVA tenderness or rebound.  Musculoskeletal:        General: No tenderness or deformity.     Right lower leg: No edema.     Left lower leg: No edema.  Skin:    General: Skin is warm and dry.  Neurological:     Mental Status: She is alert and oriented to person, place, and time.      ED Treatments / Results  Labs (all labs ordered  are listed, but only abnormal results are displayed) Labs Reviewed  BASIC METABOLIC PANEL - Abnormal; Notable for the following components:      Result Value   CO2 18 (*)    Glucose, Bld 108 (*)    Anion gap 18 (*)    All other components within normal limits  CBC WITH DIFFERENTIAL/PLATELET - Abnormal; Notable for the following components:   MCV 102.4 (*)    Platelets 144 (*)    All other components within normal limits  LIPASE, BLOOD - Abnormal; Notable for the following components:   Lipase 740 (*)    All other components within normal limits  ETHANOL  RAPID URINE DRUG SCREEN, HOSP PERFORMED  I-STAT TROPONIN, ED  I-STAT BETA HCG BLOOD, ED ([facility], [facility], [facility] ONLY)    EKG EKG Interpretation  Date/Time:  Sunday January 11 2019 10:41:14 EDT Ventricular Rate:  70 PR Interval:  136 QRS Duration: 80 QT Interval:  452 QTC Calculation: 488 R Axis:   83 Text Interpretation:  Normal sinus rhythm Baseline wander Abnormal ekg Confirmed by Gerhard MunchLockwood, Robert [id number]) on 01/11/2019 1:05:34 PM   Radiology Dg Chest 2 View  Result Date: 01/11/2019 CLINICAL DATA:  Left-sided breast/chest pain, abdominal pain EXAM: CHEST - 2 VIEW COMPARISON:  11/04/2009 FINDINGS: The heart size and mediastinal contours are within normal limits. Both lungs are clear. The visualized skeletal structures are unremarkable. IMPRESSION: No acute abnormality of the lungs.  No focal airspace opacity. Electronically Signed   By: Lauralyn PrimesAlex  Bibbey M.D.   On: 01/11/2019 11:24   Ct  Abdomen Pelvis W Contrast  Result Date: 01/11/2019 CLINICAL DATA:  Acute pancreatitis, abdominal pain EXAM: CT ABDOMEN AND PELVIS WITH CONTRAST TECHNIQUE: Multidetector CT imaging of the abdomen and pelvis was performed using the standard protocol following bolus administration of intravenous contrast. CONTRAST:  100mL OMNIPAQUE IOHEXOL 300 MG/ML  SOLN COMPARISON:  09/27/2018 FINDINGS: Lower chest: No acute abnormality. Hepatobiliary: Diffuse hypoattenuation of the liver compatible with hepatic steatosis. No biliary dilatation or obstruction. Patent hepatic and portal veins. Gallbladder biliary system unremarkable. Common bile duct nondilated. Pancreas: Moderate peripancreatic edema and fluid. Edema and free fluid extends throughout the retroperitoneum along the duodenum. Pancreas continues to enhance. No focal fluid collection. No ductal dilatation. Appearance compatible with moderate acute pancreatitis. No other complicating feature. Vascular structures all remain patent. Spleen: Normal in size without focal abnormality. Adrenals/Urinary Tract: Adrenal glands are unremarkable. Kidneys are normal, without renal calculi, focal lesion, or hydronephrosis. Bladder is unremarkable. Stomach/Bowel: Negative for bowel obstruction, significant dilatation, ileus, or free air. Normal appearing appendix. Distal bowel collapsed. No hemorrhage, hematoma, or abscess. Vascular/Lymphatic: Intact aorta. Negative for aneurysm or dissection. Mesenteric and renal vasculature all remain patent. No veno-occlusive process. No adenopathy. Reproductive: IUD within the endometrial cavity well positioned. Uterus and adnexa normal in size. No pelvic free fluid. Other: No abdominal wall hernia or abnormality. No abdominopelvic ascites. Musculoskeletal: No acute or significant osseous findings. IMPRESSION: Moderate acute pancreatitis without other complicating feature. Hepatic steatosis Electronically Signed   By: Judie PetitM.  Shick M.D.   On:  01/11/2019 14:55    Procedures Procedures (including critical care time)  Medications Ordered in ED Medications  sodium chloride flush (NS) 0.9 % injection 3 mL (has no administration in time range)  acetaminophen (TYLENOL) tablet 650 mg (650 mg Oral Given 01/11/19 1152)  sodium chloride 0.9 % bolus 1,000 mL (1,000 mLs Intravenous New Bag/Given 01/11/19 1325)  morphine 2 MG/ML injection 2 mg (2 mg Intravenous Given 01/11/19 1404)  iohexol (OMNIPAQUE) 300 MG/ML solution 100 mL (100 mLs Intravenous Contrast Given 01/11/19 1412)     Initial Impression / Assessment and Plan / ED Course  I have reviewed the triage vital signs and the nursing notes.  Pertinent labs & imaging results that were available during my care of the patient were reviewed by me and considered in my medical decision making (see chart for details).    With a previous history of alcohol induced pancreatitis presents to the ED with abdominal pain, chest pain reports symptoms began last night.  Describes her pain mostly generalized but states some of it is around the upper abdominal region.  Reports multiple episodes of vomiting and nausea.  Had been seen by her PCP who is working her up for her LFTs as they were elevated during her last visit, she has stopped using Tylenol to help resolve the elevation of her LFTs.  Today she reports worsening pain around the upper abdominal region.  I have reviewed patient's medical records which show a visit to [facility] on 11/29 2019 for a alcoholic induced pancreatitis.  Patient denies alcohol abuse, she reports she drinks 1 to 2 glasses of wine daily and night with her husband.  Into her last visit patient was intoxicated during the visit and had eloped prior to full work-up.  She reports when she was seen there they did not do anything aside for "they just drew blood and that so they did not tell me what I had.  During my evaluation patient is highly uncomfortable there is guarding on exam,  tenderness along left upper quadrant and right upper quadrant. Patient was provided with a bolus along with 2 oh morphine to help with her symptoms.  She was also given Zofran for her nausea which she reports improvement.   CMP showed no electrolyte normality, slight elevation in anion gap believe this is due to vomiting.  CBC showed no leukocytosis, lipase was remarkable for a level of 740, her previous visit does show a level of around 300. CT abdomen and pelvis was ordered as she had a previous one ordered which did not get done.  Highly suspicious for pancreatitis but will rule out any cyst, other acute process.  CT abdomen showed: Moderate acute pancreatitis without other complicating feature. Ranson Criteria negative, appropriate for outpatient management.  3:52 PM patient was informed of her results, she will be sent home with Zofran prescription along with Percocet for her pain.  She is advised to follow a bland diet.  Patient understands and agrees with management.  Husband at the bedside reports no questions.  Vital signs have been stable, patient is pain-free at the moment after morphine.  Will discharge home to follow-up with PCP.  Final Clinical Impressions(s) / ED Diagnoses   Final diagnoses:  Acute pancreatitis without infection or necrosis, unspecified pancreatitis type    ED Discharge Orders         Ordered    ondansetron (ZOFRAN) 4 MG tablet  Every 6 hours     01/11/19 1544    oxyCODONE-acetaminophen (PERCOCET/ROXICET) 5-325 MG tablet  Every 4 hours PRN     01/11/19 1544           Claude Manges, PA-C 01/11/19 1555    Gerhard Munch, MD 01/14/19 2020

## 2019-01-11 NOTE — ED Notes (Signed)
Pt's husband ambulatory to [facility] nurses' desk requesting that "someone please do something for my wife, she is really in a lot of pain." Will discuss pain medication options with EDP.

## 2019-01-11 NOTE — ED Triage Notes (Addendum)
Pt here for eval of lower stomach pain starting three days ago with some diarrhea X 2 days and emesis X 1 episode, states she woke up this morning with left sided breast/chest pain. Pt also reports she thinks she had a fever the past few days.

## 2019-01-11 NOTE — ED Notes (Signed)
Patient verbalizes understanding of discharge instructions. Opportunity for questioning and answers were provided. Armband removed by staff, pt discharged from ED ambulatory to home.  

## 2019-01-11 NOTE — ED Notes (Signed)
Patient transported to CT 

## 2019-01-11 NOTE — Discharge Instructions (Signed)
I have provided medication to help with your nausea, you may take this medication as needed.  I have also prescribed pain medication please take this for severe pain.  You also need to follow a very bland diet consisting of light meals.  Please refrain from drinking any alcohol.  Please follow-up with your primary care physician for further resolution of your lipase level as it was 740 today.

## 2019-01-11 NOTE — ED Notes (Signed)
Verbal order from MD for 650mg  of tylenol.

## 2019-01-12 ENCOUNTER — Ambulatory Visit: Payer: BLUE CROSS/BLUE SHIELD | Admitting: Family Medicine

## 2019-01-12 ENCOUNTER — Telehealth: Payer: Self-pay

## 2019-01-12 NOTE — Telephone Encounter (Signed)
Attempted to reach patient. No answer and mailbox is full. 

## 2019-01-12 NOTE — Telephone Encounter (Signed)
-----   Message from Emi Belfast, FNP sent at 01/12/2019  8:17 AM EDT ----- Please call patient and make her an appointment for ER follow up with labs.  Thanks,  [employer]

## 2019-01-12 NOTE — Telephone Encounter (Signed)
See below message from Olean Ree, FNP

## 2019-01-13 NOTE — Telephone Encounter (Signed)
I spoke with pt. She is doing better- still some swelling and pain but she is following the plan of care hospital gave her with the exception of hydrocodone. She said she is not taking this- she is taking tylenol for pain. She was unable to set up appt for follow up but will call back if needed.

## 2019-01-14 ENCOUNTER — Ambulatory Visit: Payer: BLUE CROSS/BLUE SHIELD | Admitting: Family Medicine

## 2019-02-18 ENCOUNTER — Encounter: Payer: Self-pay | Admitting: Family Medicine

## 2019-02-18 ENCOUNTER — Ambulatory Visit (INDEPENDENT_AMBULATORY_CARE_PROVIDER_SITE_OTHER): Payer: BLUE CROSS/BLUE SHIELD | Admitting: Family Medicine

## 2019-02-18 VITALS — Temp 98.6°F | Ht 64.5 in | Wt 125.0 lb

## 2019-02-18 DIAGNOSIS — J302 Other seasonal allergic rhinitis: Secondary | ICD-10-CM | POA: Diagnosis not present

## 2019-02-18 DIAGNOSIS — L02215 Cutaneous abscess of perineum: Secondary | ICD-10-CM | POA: Diagnosis not present

## 2019-02-18 DIAGNOSIS — Z8719 Personal history of other diseases of the digestive system: Secondary | ICD-10-CM | POA: Diagnosis not present

## 2019-02-18 NOTE — Progress Notes (Signed)
Virtual Visit via Video Note  I connected with Olivia Turner on 02/18/19 at 10:30 AM EDT by a video enabled telemedicine application and verified that I am speaking with the correct person using two identifiers.  The patient was in her home and I was in my office.  A portion of the visit was able to be conducted with video but the patient had connectivity issues and the remainder of the visit had to be completed with audio only.  I discussed the limitations of evaluation and management by telemedicine and the availability of in person appointments. The patient expressed understanding and agreed to proceed.  History of Present Illness: This is a 43 yo female who requests a virtual visit to discuss several concerns.   She has had recent sinus pressure and nasal drainage.  She traditionally has difficulties with her allergies in the spring and over the last week has had more pressure in her head and feels as though her nasal passages are swollen with postnasal drainage with a little sore throat.  She has an occasional dry cough.  She has not had fever.  She has been taking Zyrtec daily for many years.  She started triamcinolone nasal spray 1 week ago and she has been taking Sudafed PE 2 tablets 3 times a day with little relief.  She blows her nose frequently but cannot describe her sinus drainage.  She does not feel that her sinuses are infected.  Boil in groin- she reports that for the last 4 months she has had a boil on the side of her bikini line.  She reports that it started off the size of her thumb and has decreased in size to less than 1 inch.  She has been doing Epson salt soaks and warm compresses.  She has also been applying triple antibiotic ointment with improvement of redness.  She has not noticed any drainage.  She has had similar lesions in the past.  She feels like it is moving in the right direction and getting smaller but wanted to make me aware of it.  Acute pancreatitis- she was  seen in the emergency room last month with acute pancreatitis.  This is her second episode in the last 6 months.  When she was called for follow-up, she refused to make an appointment.  I discussed with her the need to follow-up on her lab work.  She thinks the episodes were related to increased stress.  She says she has been working to also improve her diet, eating more regularly and eating a low-fat diet.  She reports that she drinks little alcohol maybe 1 to 2 glasses of wine and always has it with food.  She denies any recent abdominal pain.  She did not have follow-up with GI as recommended stating that she had multiple medical bills.  She reports that she has been increasingly stressed lately with the COVID-19 pandemic.  She has new job responsibilities which will require her to travel out of the state for 2 to 3 days/week in the near future.  She is very anxious about this.   Observations/Objective: Patient on answers questions appropriately.  Audible nasal congestion.  Converses without difficulty no evidence of shortness of breath.  Visible skin unremarkable.  Normal mood and affect.  Assessment and Plan: 1. Seasonal allergic rhinitis, unspecified trigger -Provided written instructions to her via my chart also reviewed the following: Stop antihistamine for 3 to 5 days restart with daily Xyzal, can use Afrin type nasal  spray twice a day for 3 days maximum, continue triamcinolone nasal spray, add pseudoephedrine and Mucinex as needed -If no improvement in 5 days we will consider short course of prednisone -Follow-up precautions reviewed including fever, increased pain, productive cough/shortness of breath  2. Abscess, perineum -Per patient this is improving, provided her information regarding topical therapies and if it does not continue to improve she will make an appointment to come into the office for an I&D  3. History of pancreatitis -Discussed importance of laboratory follow-up with  her. -Encouraged her to abstain from alcohol, and to continue low-fat diet - Comprehensive metabolic panel; Future - Amylase; Future  Olean Ree, FNP-BC  [facility], [facility]  02/18/2019 4:43 PM   Follow Up Instructions:    I discussed the assessment and treatment plan with the patient. The patient was provided an opportunity to ask questions and all were answered. The patient agreed with the plan and demonstrated an understanding of the instructions.   The patient was advised to call back or seek an in-person evaluation if the symptoms worsen or if the condition fails to improve as anticipated.  I provided 20 minutes of non-face-to-face time during this encounter.   Olivia Belfast, FNP

## 2019-05-27 DIAGNOSIS — Z1159 Encounter for screening for other viral diseases: Secondary | ICD-10-CM | POA: Diagnosis not present

## 2019-07-03 DIAGNOSIS — Z6820 Body mass index (BMI) 20.0-20.9, adult: Secondary | ICD-10-CM | POA: Diagnosis not present

## 2019-07-03 DIAGNOSIS — Z01419 Encounter for gynecological examination (general) (routine) without abnormal findings: Secondary | ICD-10-CM | POA: Diagnosis not present

## 2019-09-14 ENCOUNTER — Emergency Department: Payer: BC Managed Care – PPO

## 2019-09-14 ENCOUNTER — Encounter: Payer: Self-pay | Admitting: Emergency Medicine

## 2019-09-14 ENCOUNTER — Emergency Department
Admission: EM | Admit: 2019-09-14 | Discharge: 2019-09-15 | Disposition: A | Discharge status: Home / Self Care | Payer: BC Managed Care – PPO | Attending: Emergency Medicine | Admitting: Emergency Medicine

## 2019-09-14 ENCOUNTER — Other Ambulatory Visit: Payer: Self-pay

## 2019-09-14 DIAGNOSIS — Z79899 Other long term (current) drug therapy: Secondary | ICD-10-CM | POA: Diagnosis not present

## 2019-09-14 DIAGNOSIS — R112 Nausea with vomiting, unspecified: Secondary | ICD-10-CM | POA: Diagnosis not present

## 2019-09-14 DIAGNOSIS — Z881 Allergy status to other antibiotic agents status: Secondary | ICD-10-CM | POA: Diagnosis not present

## 2019-09-14 DIAGNOSIS — R1110 Vomiting, unspecified: Secondary | ICD-10-CM | POA: Diagnosis not present

## 2019-09-14 DIAGNOSIS — Z880 Allergy status to penicillin: Secondary | ICD-10-CM | POA: Diagnosis not present

## 2019-09-14 DIAGNOSIS — K8521 Alcohol induced acute pancreatitis with uninfected necrosis: Secondary | ICD-10-CM

## 2019-09-14 DIAGNOSIS — Z7982 Long term (current) use of aspirin: Secondary | ICD-10-CM | POA: Insufficient documentation

## 2019-09-14 DIAGNOSIS — R1013 Epigastric pain: Secondary | ICD-10-CM | POA: Diagnosis not present

## 2019-09-14 DIAGNOSIS — F17210 Nicotine dependence, cigarettes, uncomplicated: Secondary | ICD-10-CM | POA: Insufficient documentation

## 2019-09-14 DIAGNOSIS — Z882 Allergy status to sulfonamides status: Secondary | ICD-10-CM | POA: Insufficient documentation

## 2019-09-14 HISTORY — DX: Acute pancreatitis without necrosis or infection, unspecified: K85.90

## 2019-09-14 LAB — CBC WITH DIFFERENTIAL/PLATELET
Abs Immature Granulocytes: 0.05 10*3/uL (ref 0.00–0.07)
Basophils Absolute: 0 10*3/uL (ref 0.0–0.1)
Basophils Relative: 0 %
Eosinophils Absolute: 0 10*3/uL (ref 0.0–0.5)
Eosinophils Relative: 0 %
HCT: 40.5 % (ref 36.0–46.0)
Hemoglobin: 14.1 g/dL (ref 12.0–15.0)
Immature Granulocytes: 1 %
Lymphocytes Relative: 8 %
Lymphs Abs: 0.8 10*3/uL (ref 0.7–4.0)
MCH: 34 pg (ref 26.0–34.0)
MCHC: 34.8 g/dL (ref 30.0–36.0)
MCV: 97.6 fL (ref 80.0–100.0)
Monocytes Absolute: 0.5 10*3/uL (ref 0.1–1.0)
Monocytes Relative: 4 %
Neutro Abs: 9.1 10*3/uL — ABNORMAL HIGH (ref 1.7–7.7)
Neutrophils Relative %: 87 %
Platelets: 148 10*3/uL — ABNORMAL LOW (ref 150–400)
RBC: 4.15 MIL/uL (ref 3.87–5.11)
RDW: 12.3 % (ref 11.5–15.5)
WBC: 10.4 10*3/uL (ref 4.0–10.5)
nRBC: 0 % (ref 0.0–0.2)

## 2019-09-14 LAB — URINALYSIS, COMPLETE (UACMP) WITH MICROSCOPIC
Bacteria, UA: NONE SEEN
Bilirubin Urine: NEGATIVE
Glucose, UA: NEGATIVE mg/dL
Hgb urine dipstick: NEGATIVE
Ketones, ur: 20 mg/dL — AB
Leukocytes,Ua: NEGATIVE
Nitrite: NEGATIVE
Protein, ur: 30 mg/dL — AB
Specific Gravity, Urine: 1.025 (ref 1.005–1.030)
pH: 6 (ref 5.0–8.0)

## 2019-09-14 LAB — COMPREHENSIVE METABOLIC PANEL
ALT: 92 U/L — ABNORMAL HIGH (ref 0–44)
AST: 67 U/L — ABNORMAL HIGH (ref 15–41)
Albumin: 4.7 g/dL (ref 3.5–5.0)
Alkaline Phosphatase: 72 U/L (ref 38–126)
Anion gap: 11 (ref 5–15)
BUN: 15 mg/dL (ref 6–20)
CO2: 24 mmol/L (ref 22–32)
Calcium: 9.2 mg/dL (ref 8.9–10.3)
Chloride: 98 mmol/L (ref 98–111)
Creatinine, Ser: 0.62 mg/dL (ref 0.44–1.00)
GFR calc Af Amer: >60 mL/min (ref >60)
GFR calc non Af Amer: >60 mL/min (ref >60)
Glucose, Bld: 139 mg/dL — ABNORMAL HIGH (ref 70–99)
Potassium: 4.4 mmol/L (ref 3.5–5.1)
Sodium: 133 mmol/L — ABNORMAL LOW (ref 135–145)
Total Bilirubin: 1 mg/dL (ref 0.3–1.2)
Total Protein: 7.7 g/dL (ref 6.5–8.1)

## 2019-09-14 LAB — LIPASE, BLOOD: Lipase: 724 U/L — ABNORMAL HIGH (ref 11–51)

## 2019-09-14 MED ORDER — PROMETHAZINE HCL 25 MG/ML IJ SOLN
25.0000 mg | Freq: Once | INTRAMUSCULAR | Status: AC
  Administered 2019-09-14: 25 mg via INTRAVENOUS
  Filled 2019-09-14: qty 1

## 2019-09-14 MED ORDER — ONDANSETRON HCL 4 MG/2ML IJ SOLN
4.0000 mg | Freq: Once | INTRAMUSCULAR | Status: AC
  Administered 2019-09-14: 4 mg via INTRAVENOUS
  Filled 2019-09-14: qty 2

## 2019-09-14 MED ORDER — MORPHINE SULFATE (PF) 4 MG/ML IV SOLN
4.0000 mg | Freq: Once | INTRAVENOUS | Status: DC
  Filled 2019-09-14: qty 1

## 2019-09-14 MED ORDER — MORPHINE SULFATE (PF) 4 MG/ML IV SOLN
4.0000 mg | Freq: Once | INTRAVENOUS | Status: AC
  Administered 2019-09-14: 4 mg via INTRAVENOUS
  Filled 2019-09-14: qty 1

## 2019-09-14 MED ORDER — SODIUM CHLORIDE 0.9 % IV BOLUS
1000.0000 mL | Freq: Once | INTRAVENOUS | Status: AC
  Administered 2019-09-14: 1000 mL via INTRAVENOUS

## 2019-09-14 MED ORDER — IOHEXOL 300 MG/ML  SOLN
100.0000 mL | Freq: Once | INTRAMUSCULAR | Status: AC | PRN
  Administered 2019-09-14: 100 mL via INTRAVENOUS

## 2019-09-14 MED ORDER — OXYCODONE-ACETAMINOPHEN 5-325 MG PO TABS: 1.0000 | ORAL_TABLET | ORAL | 0 refills | Status: DC | PRN

## 2019-09-14 MED ORDER — ONDANSETRON 4 MG PO TBDP: 4.0000 mg | ORAL_TABLET | Freq: Three times a day (TID) | ORAL | 0 refills | Status: DC | PRN

## 2019-09-14 NOTE — ED Triage Notes (Signed)
Abdominal pain and vomiting x 3 days. History of pancreatitis.

## 2019-09-14 NOTE — ED Notes (Signed)
Urine sent to lab, urine pregnancy negative

## 2019-09-14 NOTE — ED Provider Notes (Signed)
[facility] Emergency Department Provider Note  ____________________________________________   None    (approximate)   I have reviewed the triage vital signs and the nursing notes.   Patient has been triaged with a MSE exam performed by myself at a minimum. Based on symptoms and screening exam, patient may receive a more in-depth exam, labs, imaging as detailed below. Patients have been advised of this setting and exam type at the time of patient interview.    HISTORY  Chief Complaint No chief complaint on file.    HPI Olivia Turner is a 43 y.o. female presents to the emergency department with a complaint of fever, generalized abdominal pain. Nausea and vomiting. Patient has a HX of pancreatitis and states that symptoms feel the same as her last episode in march. Patient states temp max of 102.6 at home. She has been drinking and eating appropriately until today. Patient has had numerous episodes of emesis today. .   Patient will receive a medical screening exam as detailed below.  Based off of this exam, more in depth exam, labs, imaging will be performed as needed for complaint.  Patient care will be eventually transferred to another provider in the emergency department for final exam, diagnosis and disposition.    Past Medical History:  Diagnosis Date  . Anxiety   . GERD (gastroesophageal reflux disease)     Patient Active Problem List   Diagnosis Date Noted  . Paroxysmal tachycardia (HCC) 09/22/2018  . Chest pain 09/21/2018  . Smoker 09/21/2018  . Moderate anxiety 09/17/2018    Past Surgical History:  Procedure Laterality Date  . BREAST ENHANCEMENT SURGERY      Prior to Admission medications   Medication Sig Start Date End Date Taking? Authorizing Provider  ALPRAZolam (XANAX) 0.25 MG tablet Take 1 tablet (0.25 mg total) by mouth 2 (two) times daily as needed for anxiety. 09/22/18   Emi Belfast, FNP  aspirin 81 MG chewable  tablet Chew 81 mg by mouth daily.    [provider]  BIOTIN PO Take 5,000 mg by mouth at bedtime.    [provider]  Cyanocobalamin (VITAMIN B-12 IJ) Inject 1,200 mg as directed once a week.     [provider]  Emollient (COLLAGEN EX) Take 6,000 mg by mouth daily.    [provider]  FLAXSEED, LINSEED, PO Take 1 tablet by mouth at bedtime.     [provider]  levonorgestrel (MIRENA, 52 MG,) 20 MCG/24HR IUD 1 each by Intrauterine route.  06/25/16   [provider]  Multiple Vitamin (MULTI-VITAMIN DAILY PO) Take 1 tablet by mouth daily.     [provider]  omeprazole (PRILOSEC) 20 MG capsule Take 20 mg by mouth daily.    [provider]  Probiotic Product (PROBIOTIC DAILY PO) Take 5 mLs by mouth daily. Powder to mix with water    [provider]    Allergies Penicillins, Sulfa antibiotics, and Cephalosporins  Family History  Problem Relation Age of Onset  . Hyperlipidemia Mother   . Heart attack Father 35  . Hyperlipidemia Father   . Atrial fibrillation Brother   . Heart attack Maternal Uncle 45  . Mitral valve prolapse Maternal Aunt        Mitral valve replacement     Social History Social History   Tobacco Use  . Smoking status: Current Every Day Smoker    Packs/day: 1.00    Years: 20.00    Pack  years: 20.00    Types: Cigarettes  . Smokeless tobacco: Never Used  Substance Use Topics  . Alcohol use: Yes    Alcohol/week: 3.0 standard drinks    Types: 3 Glasses of wine per week  . Drug use: Never    Review of Systems Constitutional: positive fever ENT: no nasal congestion/rhinorhea. no sore throat Cardiovascular: no chest pain. Respiratory: no cough. no shortness of breath/difficulty breathing Gastroenterology: positive diffuse abdominal pain Musculoskeletal: no for musculoskeletal pain Integumentary: Negative for rash. Neurological: No focal weakness nor numbness.    ____________________________________________   PHYSICAL EXAM:  VITAL SIGNS: ED Triage Vitals  Enc Vitals Group     BP      Pulse      Resp      Temp      Temp src      SpO2      Weight      Height      Head Circumference      Peak Flow      Pain Score      Pain Loc      Pain Edu?      Excl. in [place]?     Constitutional: Alert and oriented. Generally well appearing and in no acute distress. Eyes: Conjunctivae are normal.  Nose: No significant congestion/rhinnorhea. Mouth: No gross oropharyngeal edema. no erythema/edema Neck: No stridor.  No meningeal signs.   Cardiovascular: Grossly normal heart sounds. Respiratory: Normal respiratory effort without significant tachypnea and no observed retractions. Lungs CTAB Gastrointestinal: No significant visible abdominal wall findings.  Bowel sounds x4 quadrants. Epigastric and RUQ tenderness to palpation. No CVA tenderness Musculoskeletal: No gross deformities of extremities. Neurologic:  Normal speech and language. No gross focal neurologic deficits are appreciated.  Skin:  Skin is warm, dry and intact. No rash noted.    ____________________________________________   LABS (all labs ordered are listed, but only abnormal results are displayed)  Labs Reviewed - No data to display  ____________________________________________   RADIOLOGY   Official radiology report(s): No results found.  ____________________________________________    INITIAL IMPRESSION / MDM / ASSESSMENT AND PLAN / ED COURSE  As part of my medical decision making, I reviewed the following data within the electronic MEDICAL RECORD NUMBER Notes from prior ED visits      Clinical Impression: abdominal pain, n/v, fever   Plan: IV, fluids, pain medication, antiemetic, labs, urinalysis, CT scan  Patient has been screened based based on their arrival complaint, evaluated for an emergent condition, and at a minimum has received a medical screening exam.  At  this time, patient will receive the further work-up listed above that was determined by medical screening exam.  Patient care will be transferred to another provider in the emergency department once patient is roomed for final diagnosis and disposition.    ____________________________________________  Note:  This document was prepared using Systems analyst and may include unintentional dictation errors.    Darletta Moll, PA-C 09/14/19 1806    Earleen Newport, MD 09/16/19 1048

## 2019-09-14 NOTE — ED Provider Notes (Signed)
[facility] Emergency Department Provider Note  ____________________________________________  Time seen: Approximately 11:27 PM  I have reviewed the triage vital signs and the nursing notes.   HISTORY  Chief Complaint Abdominal Pain and Emesis   HPI Olivia Turner is a 43 y.o. female the history of alcohol induced pancreatitis who presents for evaluation of abdominal pain.  Patient reports that her symptoms started today.  She is complaining of severe sharp epigastric abdominal pain radiating to her back.  Has had a fever of 102.70F at home.  Had over 25 episodes of nonbloody nonbilious emesis.  Patient reports having several drinks 2 nights ago while watching football.  She denies diarrhea or constipation, dysuria or hematuria, chest pain, shortness of breath.   Past Medical History:  Diagnosis Date  . Anxiety   . GERD (gastroesophageal reflux disease)   . Pancreatitis     Patient Active Problem List   Diagnosis Date Noted  . Paroxysmal tachycardia ([place]) 09/22/2018  . Chest pain 09/21/2018  . Smoker 09/21/2018  . Moderate anxiety 09/17/2018    Past Surgical History:  Procedure Laterality Date  . BREAST ENHANCEMENT SURGERY      Prior to Admission medications   Medication Sig Start Date End Date Taking? Authorizing Provider  ALPRAZolam (XANAX) 0.25 MG tablet Take 1 tablet (0.25 mg total) by mouth 2 (two) times daily as needed for anxiety. 09/22/18   Elby Beck, FNP  aspirin 81 MG chewable tablet Chew 81 mg by mouth daily.    [provider]  BIOTIN PO Take 5,000 mg by mouth at bedtime.    [provider]  Cyanocobalamin (VITAMIN B-12 IJ) Inject 1,200 mg as directed once a week.     [provider]  Emollient (COLLAGEN EX) Take 6,000 mg by mouth daily.    [provider]  FLAXSEED, LINSEED, PO Take 1 tablet by mouth at bedtime.     [provider]  levonorgestrel (MIRENA, 52 MG,) 20  MCG/24HR IUD 1 each by Intrauterine route.  06/25/16   [provider]  Multiple Vitamin (MULTI-VITAMIN DAILY PO) Take 1 tablet by mouth daily.     [provider]  omeprazole (PRILOSEC) 20 MG capsule Take 20 mg by mouth daily.    [provider]  ondansetron (ZOFRAN ODT) 4 MG disintegrating tablet Take 1 tablet (4 mg total) by mouth every 8 (eight) hours as needed. 09/14/19   Rudene Re, MD  oxyCODONE-acetaminophen (PERCOCET) 5-325 MG tablet Take 1 tablet by mouth every 4 (four) hours as needed. 09/14/19   Rudene Re, MD  Probiotic Product (PROBIOTIC DAILY PO) Take 5 mLs by mouth daily. Powder to mix with water    [provider]    Allergies Penicillins, Sulfa antibiotics, and Cephalosporins  Family History  Problem Relation Age of Onset  . Hyperlipidemia Mother   . Heart attack Father 68  . Hyperlipidemia Father   . Atrial fibrillation Brother   . Heart attack Maternal Uncle 45  . Mitral valve prolapse Maternal Aunt        Mitral valve replacement     Social History Social History   Tobacco Use  . Smoking status: Current Every Day Smoker    Packs/day: 1.00    Years: 20.00    Pack years: 20.00    Types: Cigarettes  . Smokeless tobacco: Never Used  Substance Use Topics  . Alcohol use: Yes    Alcohol/week: 3.0 standard drinks    Types: 3  Glasses of wine per week  . Drug use: Never    Review of Systems  Constitutional: + fever. Eyes: Negative for visual changes. ENT: Negative for sore throat. Neck: No neck pain  Cardiovascular: Negative for chest pain. Respiratory: Negative for shortness of breath. Gastrointestinal: + epigastric abdominal pain, nausea, and vomiting. No diarrhea. Genitourinary: Negative for dysuria. Musculoskeletal: Negative for back pain. Skin: Negative for rash. Neurological: Negative for headaches, weakness or numbness. Psych: No SI or HI  ____________________________________________   PHYSICAL  EXAM:  VITAL SIGNS: ED Triage Vitals  Enc Vitals Group     BP 09/14/19 1756 (!) 153/96     Pulse Rate 09/14/19 1756 76     Resp 09/14/19 1756 20     Temp 09/14/19 1756 98.3 F (36.8 C)     Temp Source 09/14/19 1756 Oral     SpO2 09/14/19 1756 100 %     Weight 09/14/19 1758 127 lb (57.6 kg)     Height 09/14/19 1758 5' 5.5" (1.664 m)     Head Circumference --      Peak Flow --      Pain Score 09/14/19 1758 9     Pain Loc --      Pain Edu? --      Excl. in [place]? --     Constitutional: Alert and oriented. Well appearing and in no apparent distress. HEENT:      Head: Normocephalic and atraumatic.         Eyes: Conjunctivae are normal. Sclera is non-icteric.       Mouth/Throat: Mucous membranes are moist.       Neck: Supple with no signs of meningismus. Cardiovascular: Regular rate and rhythm. No murmurs, gallops, or rubs. 2+ symmetrical distal pulses are present in all extremities. No JVD. Respiratory: Normal respiratory effort. Lungs are clear to auscultation bilaterally. No wheezes, crackles, or rhonchi.  Gastrointestinal: Soft, tender to palpation in the epigastric region, and non distended with positive bowel sounds. No rebound or guarding. Genitourinary: No CVA tenderness. Musculoskeletal: Nontender with normal range of motion in all extremities. No edema, cyanosis, or erythema of extremities. Neurologic: Normal speech and language. Face is symmetric. Moving all extremities. No gross focal neurologic deficits are appreciated. Skin: Skin is warm, dry and intact. No rash noted. Psychiatric: Mood and affect are normal. Speech and behavior are normal.  ____________________________________________   LABS (all labs ordered are listed, but only abnormal results are displayed)  Labs Reviewed  CBC WITH DIFFERENTIAL/PLATELET - Abnormal; Notable for the following components:      Result Value   Platelets 148 (*)    Neutro Abs 9.1 (*)    All other components within normal limits   COMPREHENSIVE METABOLIC PANEL - Abnormal; Notable for the following components:   Sodium 133 (*)    Glucose, Bld 139 (*)    AST 67 (*)    ALT 92 (*)    All other components within normal limits  LIPASE, BLOOD - Abnormal; Notable for the following components:   Lipase 724 (*)    All other components within normal limits  URINALYSIS, COMPLETE (UACMP) WITH MICROSCOPIC - Abnormal; Notable for the following components:   Color, Urine YELLOW (*)    APPearance CLEAR (*)    Ketones, ur 20 (*)    Protein, ur 30 (*)    All other components within normal limits  POC URINE PREG, ED   ____________________________________________  EKG  none  ____________________________________________  RADIOLOGY  I have personally  reviewed the images performed during this visit and I agree with the Radiologist's read.   Interpretation by Radiologist:  Ct Abdomen Pelvis W Contrast  Result Date: 09/14/2019 CLINICAL DATA:  Abdominal pain with fever. Nausea and vomiting. History of pancreatitis. EXAM: CT ABDOMEN AND PELVIS WITH CONTRAST TECHNIQUE: Multidetector CT imaging of the abdomen and pelvis was performed using the standard protocol following bolus administration of intravenous contrast. CONTRAST:  128m OMNIPAQUE IOHEXOL 300 MG/ML  SOLN COMPARISON:  January 11, 2019 FINDINGS: Lower chest: The lung bases are clear. The heart size is normal. Hepatobiliary: There is decreased hepatic attenuation suggestive of hepatic steatosis. Normal gallbladder.There is no biliary ductal dilation. Pancreas: There is peripancreatic fat stranding and free fluid. There is a low-attenuation area within the pancreatic body measuring approximately 2.1 cm. Spleen: No splenic laceration or hematoma. Adrenals/Urinary Tract: --Adrenal glands: No adrenal hemorrhage. --Right kidney/ureter: No hydronephrosis or perinephric hematoma. --Left kidney/ureter: No hydronephrosis or perinephric hematoma. --Urinary bladder: The bladder is decompressed  and therefore poorly evaluated. Stomach/Bowel: --Stomach/Duodenum: No hiatal hernia or other gastric abnormality. Normal duodenal course and caliber. --Small bowel: No dilatation or inflammation. --Colon: No focal abnormality. --Appendix: Normal. Vascular/Lymphatic: Normal course and caliber of the major abdominal vessels. --No retroperitoneal lymphadenopathy. --No mesenteric lymphadenopathy. --No pelvic or inguinal lymphadenopathy. Reproductive: There is an IUD within the pelvis. Other: There is a small amount of free for the abdominal wall is normal. Musculoskeletal. No acute displaced fractures. IMPRESSION: 1. Acute interstitial pancreatitis.  No drainable fluid collection. 2. Low-attenuation area near the pancreatic body is favored to represent focal edema with pancreatic necrosis not excluded. 3. Hepatic steatosis. Electronically Signed   By: CConstance HolsterM.D.   On: 09/14/2019 20:21     ____________________________________________   PROCEDURES  Procedure(s) performed: None Procedures Critical Care performed:  None ____________________________________________   INITIAL IMPRESSION / ASSESSMENT AND PLAN / ED COURSE  43y.o. female the history of alcohol induced pancreatitis who presents for evaluation of abdominal pain.  Patient is well-appearing in no distress with normal vital signs, abdomen is soft with epigastric tenderness, no rebound or guarding.  Differential diagnosis including pancreatitis versus gallbladder disease versus SBO versus gastritis versus peptic ulcer disease.  Labs showing lipase of 724.  Normal T bili and alk phos.  CT abdomen pelvis confirms pancreatitis with possible necrosis, no abscess. Patient has no signs of sepsis. Low risk per RNorval Gablecriteria however due to findings of necrosis on CT recommended admission for pain control and monitoring. Patient refusing admission. Reports pain is well controlled she wishes to go home.  I explained to the patient the risks  associated with going home including pancreatic necrosis, sepsis, abscess formation requiring surgery.  Patient understands the risks and still wishes to go home.  I discussed with her the importance of avoiding alcohol.  I discussed bland diet and pain control at home.  Discussed return precautions and close follow-up with PCP.  I also told patient she is welcome to come back at any time if she changes her mind and wishes to be admitted.       As part of my medical decision making, I reviewed the following data within the eReubensnotes reviewed and incorporated, Labs reviewed , Old chart reviewed, Radiograph reviewed , Notes from prior ED visits and [place] Controlled Substance Database   Patient was evaluated in Emergency Department today for the symptoms described in the history of present illness. Patient was evaluated in the context of the  global COVID-19 pandemic, which necessitated consideration that the patient might be at risk for infection with the SARS-CoV-2 virus that causes COVID-19. Institutional protocols and algorithms that pertain to the evaluation of patients at risk for COVID-19 are in a state of rapid change based on information released by regulatory bodies including the CDC and federal and state organizations. These policies and algorithms were followed during the patient's care in the ED.   ____________________________________________   FINAL CLINICAL IMPRESSION(S) / ED DIAGNOSES   Final diagnoses:  Alcohol-induced acute pancreatitis with uninfected necrosis      NEW MEDICATIONS STARTED DURING THIS VISIT:  ED Discharge Orders         Ordered    ondansetron (ZOFRAN ODT) 4 MG disintegrating tablet  Every 8 hours PRN     09/14/19 2327    oxyCODONE-acetaminophen (PERCOCET) 5-325 MG tablet  Every 4 hours PRN     09/14/19 2327           Note:  This document was prepared using Dragon voice recognition software and may include unintentional  dictation errors.    Alfred Levins, [place], MD 09/14/19 [id number]

## 2019-09-14 NOTE — ED Notes (Signed)
Report given to Susan RN 

## 2019-09-15 NOTE — ED Notes (Signed)
Pt refused to sign for d/c. Pt in a rush to leave.

## 2019-09-16 IMAGING — US US ABDOMEN LIMITED
1 series · 14 of 25 positions shown · non-contrast
Comparison: 11/04/2009 abdominal ultrasound.

CLINICAL DATA: 42 y/o  F; transaminitis.  Lipase 314.

EXAM:
ULTRASOUND ABDOMEN LIMITED RIGHT UPPER QUADRANT

[Series 1: us abdomen limited · 14 of 57 slices shown]
[im 1/57]
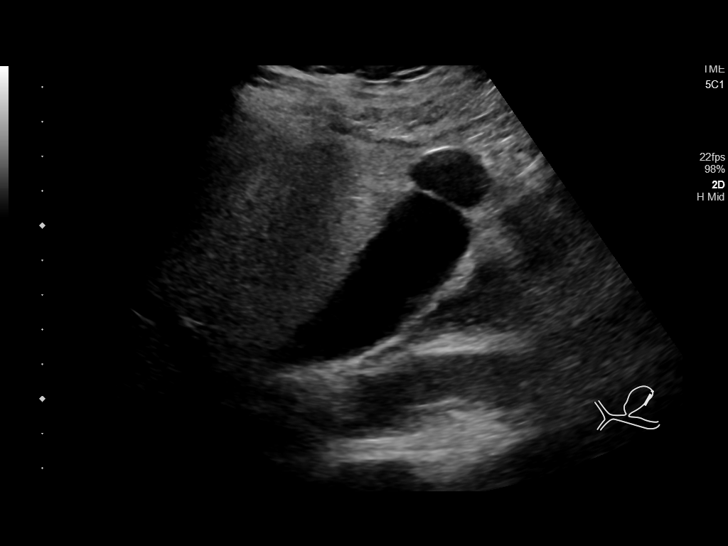
[im 5/57]
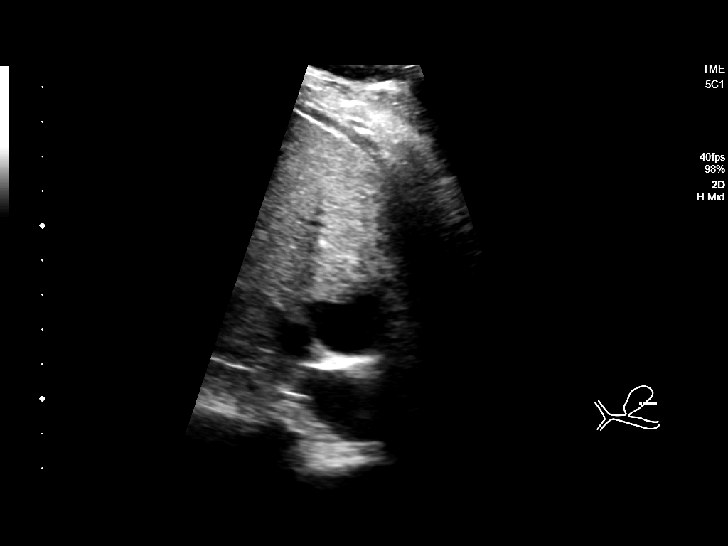
[im 10/57]
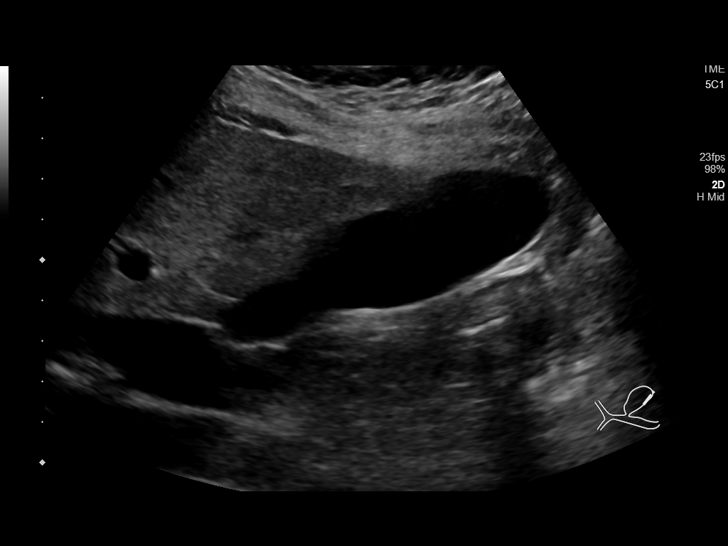
[im 15/57]
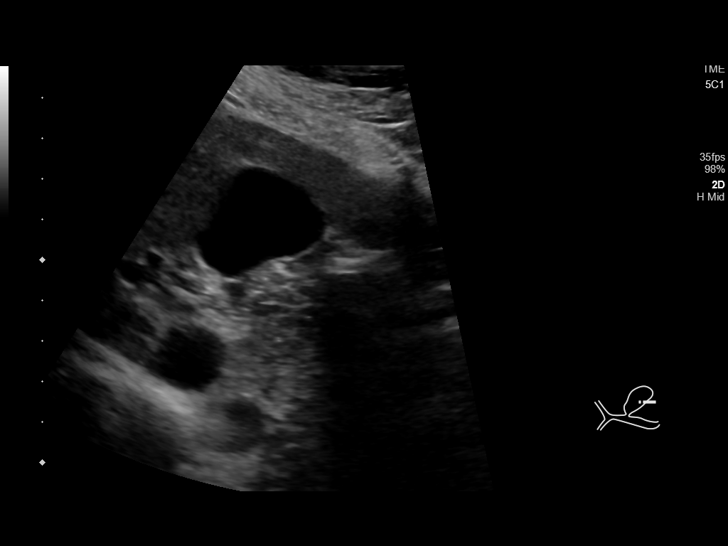
[im 19/57]
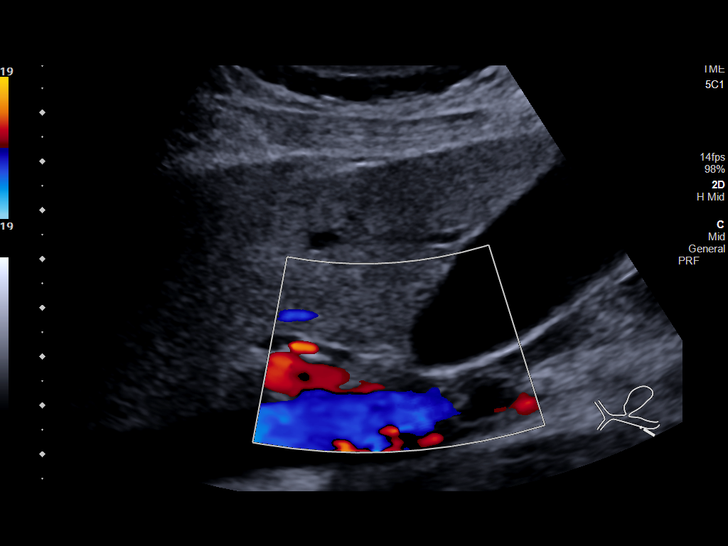
[im 22/57]
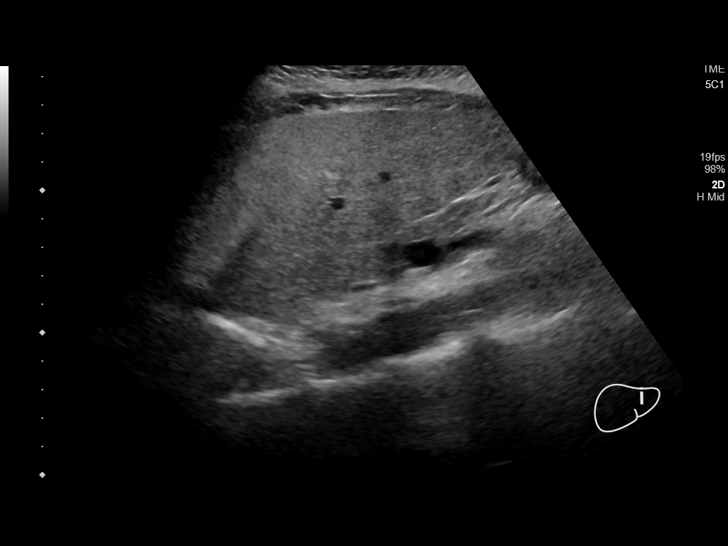
[im 26/57]
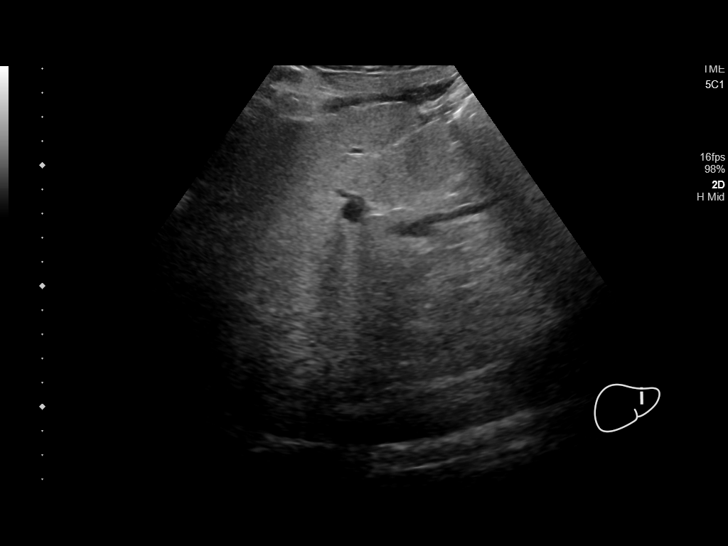
[im 31/57]
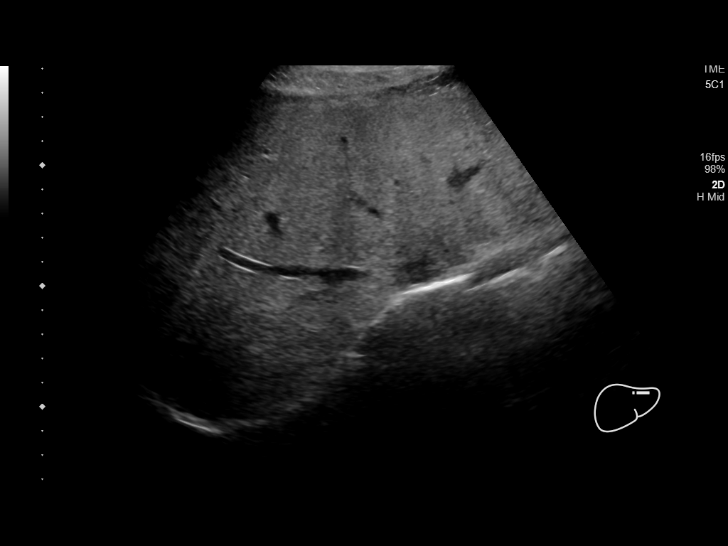
[im 36/57]
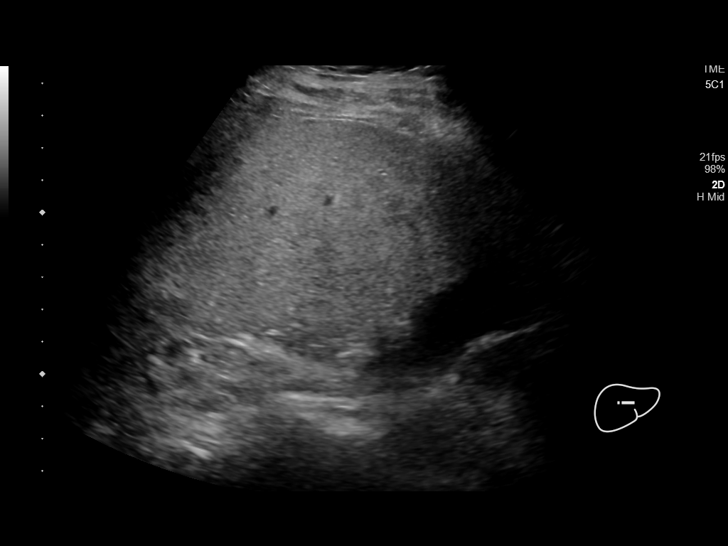
[im 38/57]
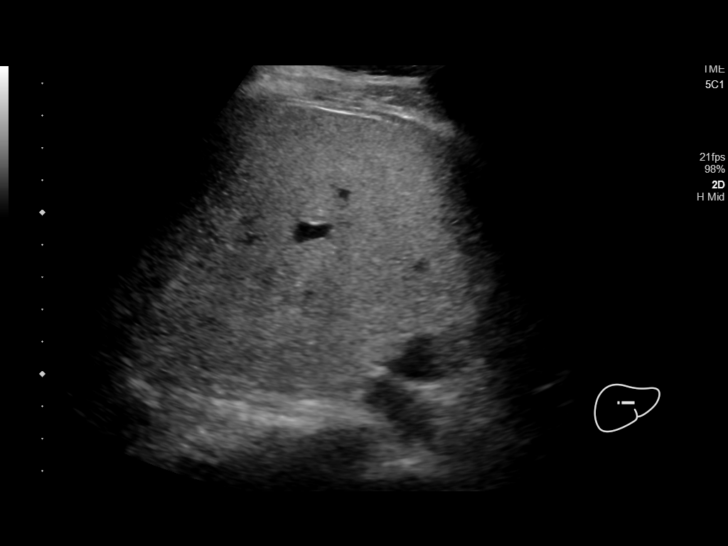
[im 43/57]
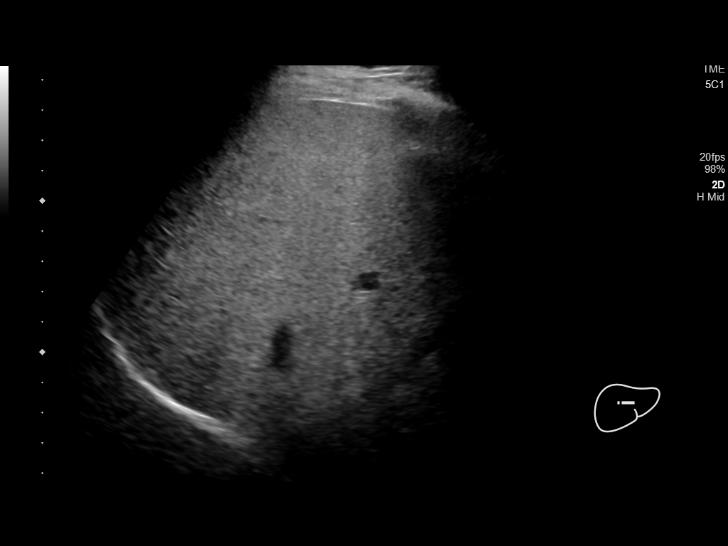
[im 47/57]
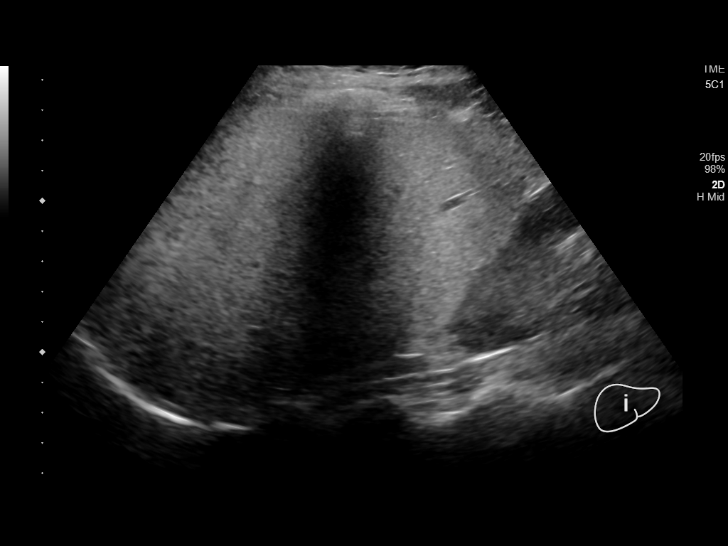
[im 52/57]
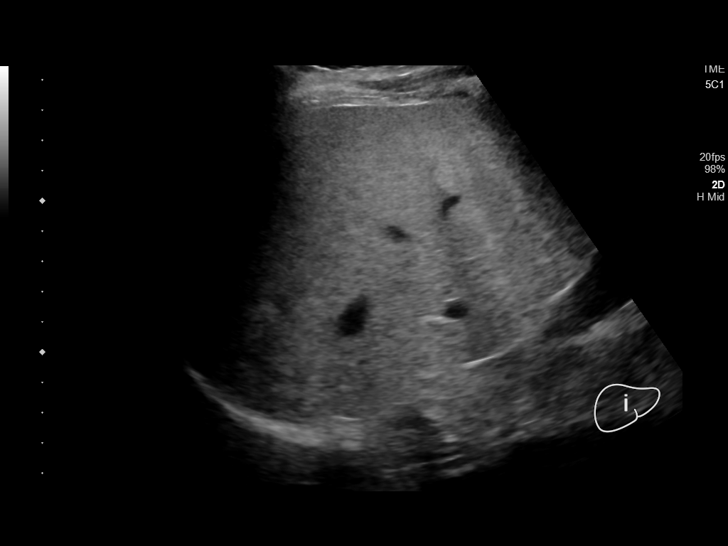
[im 57/57]
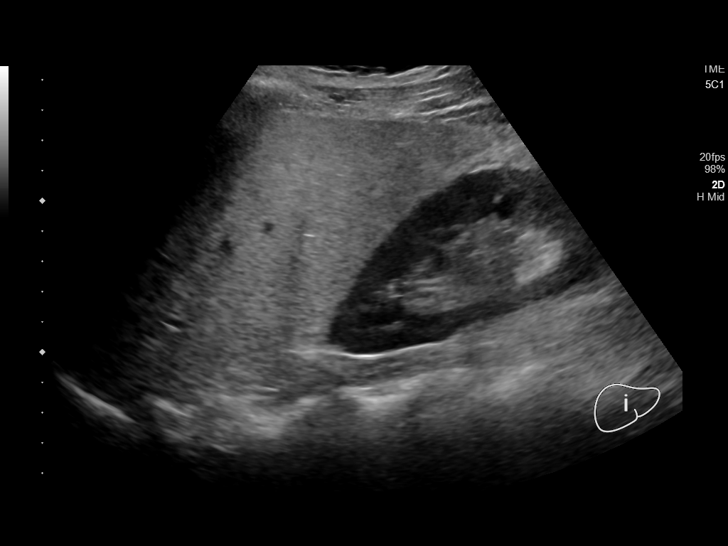

[14 of 25 positions shown; findings below may reference images not displayed]

FINDINGS: Gallbladder:

No gallstones or wall thickening visualized. No sonographic Murphy
sign noted by sonographer.

Common bile duct:

Diameter: 1.8 mm

Liver:

No focal lesion identified. Diffusely increased liver echogenicity.
Portal vein is patent on color Doppler imaging with normal direction
of blood flow towards the liver.
IMPRESSION: No acute process identified.  Hepatic steatosis.

## 2019-09-17 DIAGNOSIS — Z20828 Contact with and (suspected) exposure to other viral communicable diseases: Secondary | ICD-10-CM | POA: Diagnosis not present

## 2019-10-03 IMAGING — CT CT HEART SCORING
2 series · 16 of 20 positions shown, 18 images · non-contrast
Comparison: None.

Addendum:
EXAM:
OVER-READ INTERPRETATION  CT CHEST

The following report is an over-read performed by radiologist Dr.
Rigen Pacis [[email or web address]] on 10/14/2018. This over-read
does not include interpretation of cardiac or coronary anatomy or
pathology. The coronary calcium score/coronary CTA interpretation by
the cardiologist is attached.
CLINICAL DATA: Risk stratification
Coronary Calcium Score
TECHNIQUE: The patient was scanned on a Siemens Somatom 64 slice scanner. Axial
non-contrast 3 mm slices were carried out through the heart. The
data set was analyzed on a dedicated work station and scored using
the Agatson method.

[Series 2: casc 3.0 i36f 2 bestdiast 72 % · axial · 0.35mm/px · z∈[-255,-144]mm · 8 of 49 slices shown, 10 images]
[im 6/49  vessel]
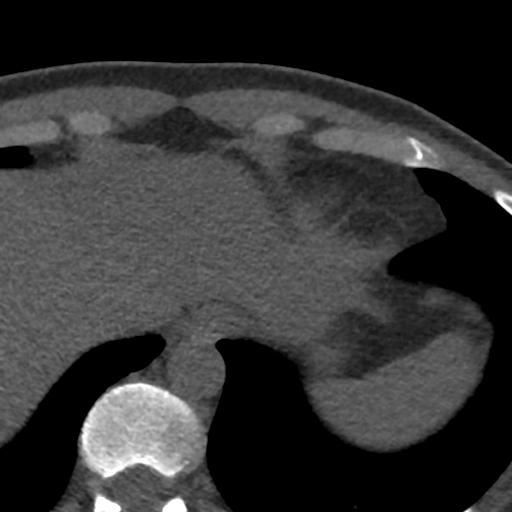
[im 6/49  lung]
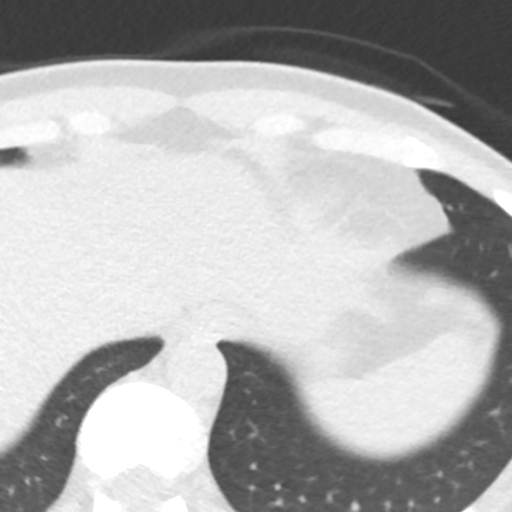
[im 11/49  vessel]
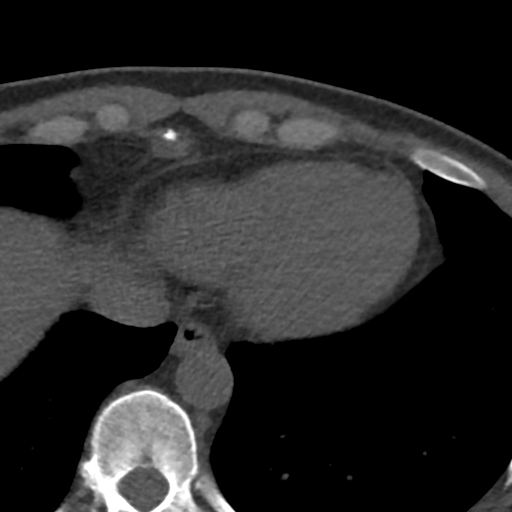
[im 17/49  vessel]
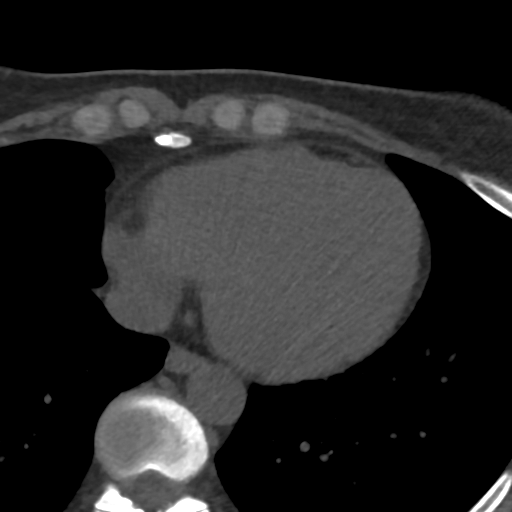
[im 22/49  vessel]
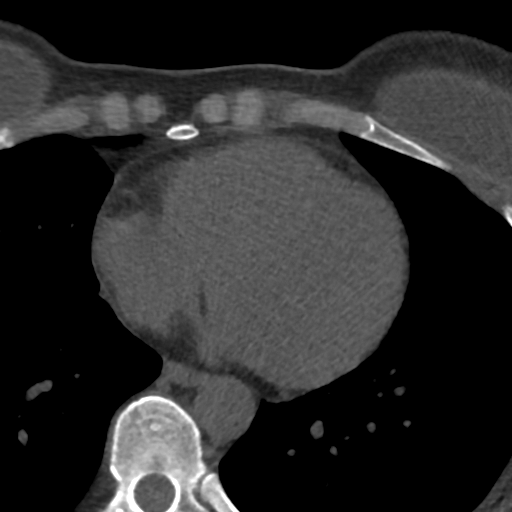
[im 27/49  vessel]
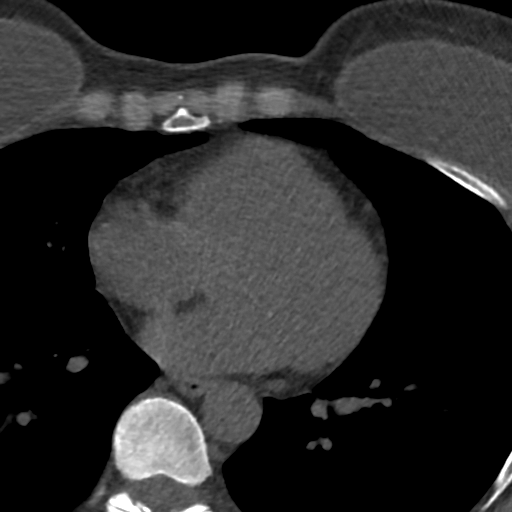
[im 27/49  lung]
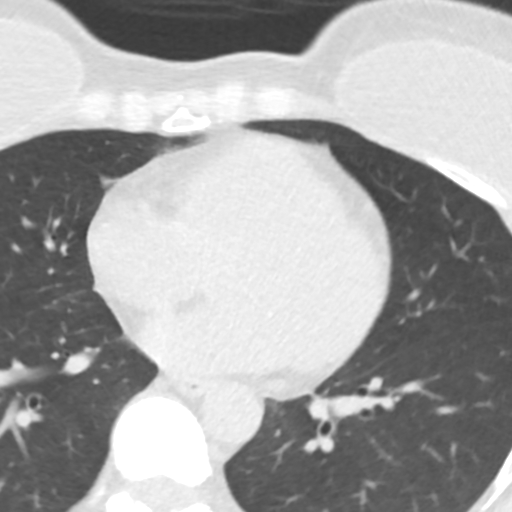
[im 33/49  vessel]
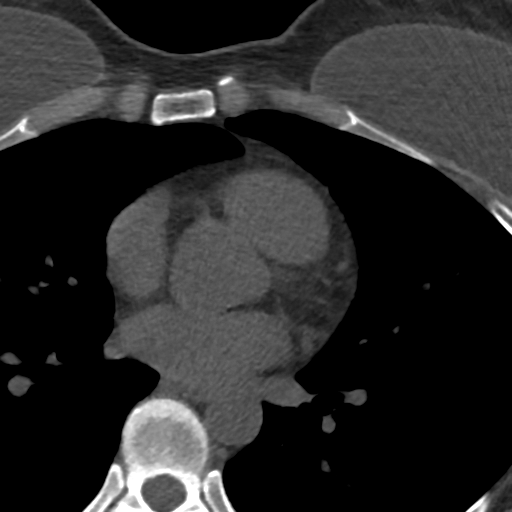
[im 38/49  vessel]
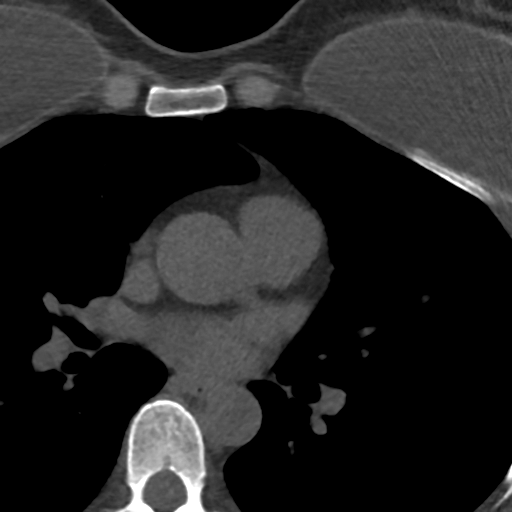
[im 43/49  vessel]
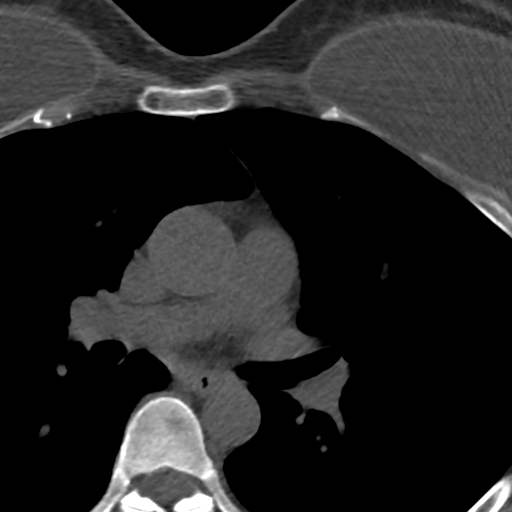

[Series 4: lung st 73 % · axial · 0.68mm/px · z∈[-255,-144]mm · 8 of 49 slices shown]
[im 6/49  lung]
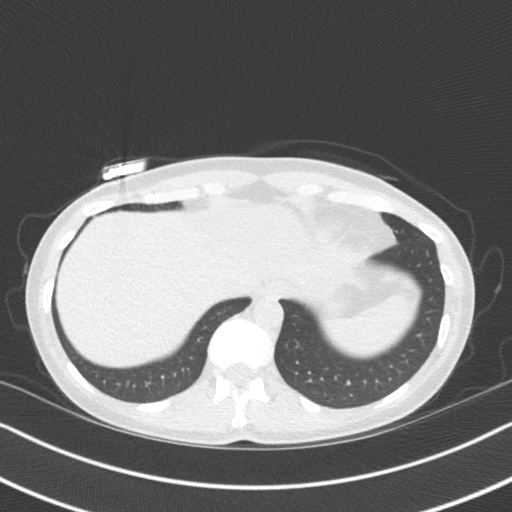
[im 11/49  lung]
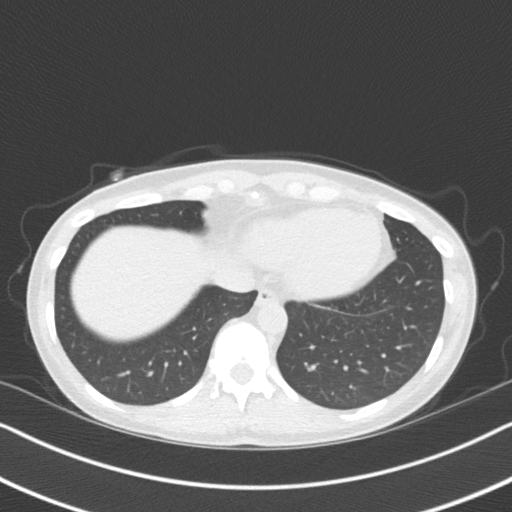
[im 17/49  lung]
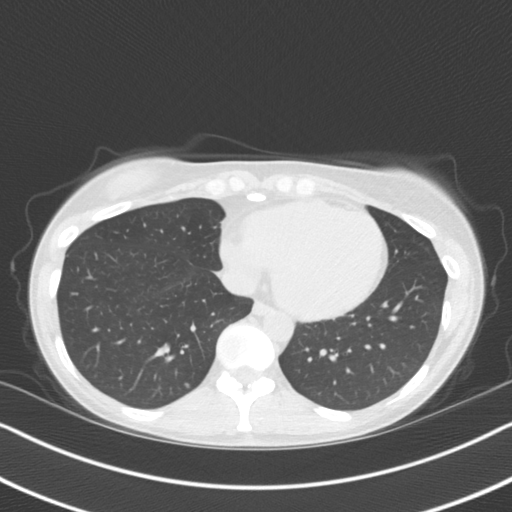
[im 22/49  lung]
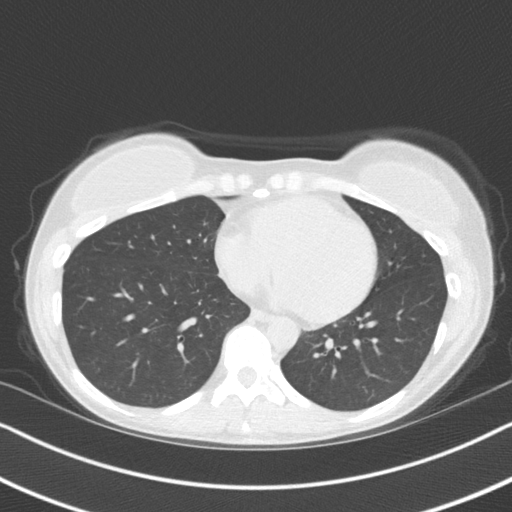
[im 27/49  lung]
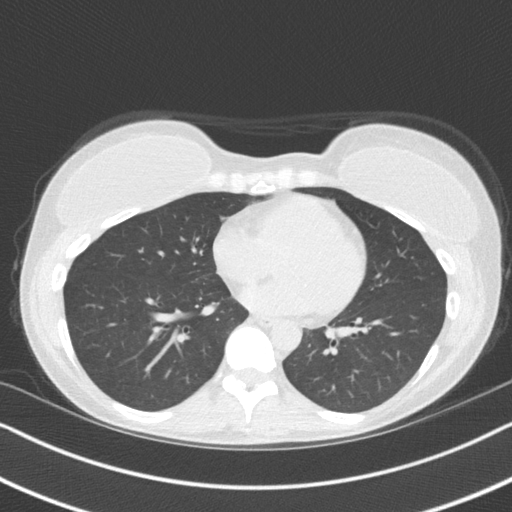
[im 33/49  lung]
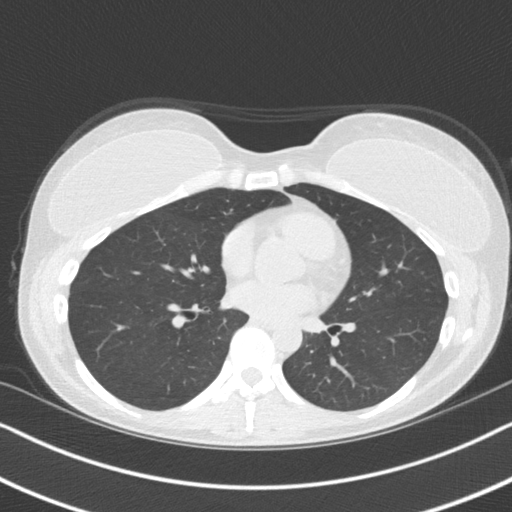
[im 38/49  lung]
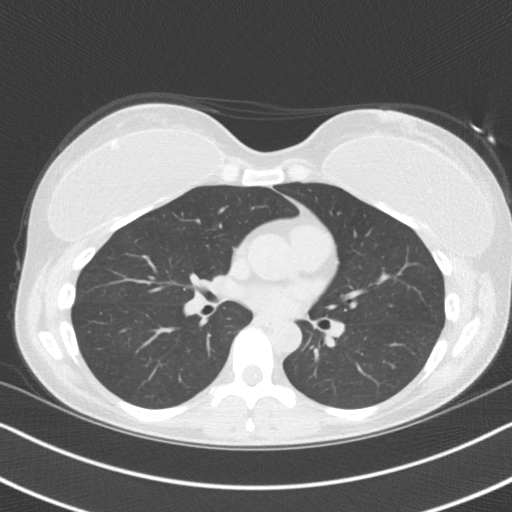
[im 43/49  lung]
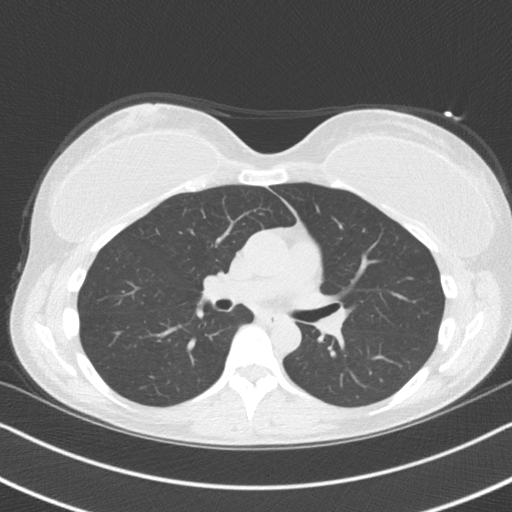

[16 of 20 positions shown; findings below may reference images not displayed]

FINDINGS: Vascular: Normal caliber of the visualized thoracic aorta. Heart
size is normal without significant pericardial fluid. Normal caliber
of the main pulmonary arteries.

Mediastinum/Nodes: Precarinal lymph node is mildly prominent
measuring 1 cm in the short axis. Otherwise, no significant lymph
node enlargement in the visualized mediastinum.

Lungs/Pleura: 4 mm nodule in the right lower lobe on sequence 3,
image 33. Otherwise, the lungs are clear. No large pleural
effusions.

Upper Abdomen: Low-density structure at the left hepatic dome may
represent a small cyst and measures roughly 1.0 cm. Otherwise,
images of the upper abdomen are unremarkable.

Musculoskeletal: Bilateral breast implants. Visualized bone
structures are unremarkable.
IMPRESSION: 1. No acute abnormality.
2. 4 mm nodule in the right lower lobe is indeterminate but likely
benign. No follow-up needed if patient is low-risk. Non-contrast
chest CT can be considered in 12 months if patient is high-risk.
This recommendation follows the consensus statement: Guidelines for
Management of Incidental Pulmonary Nodules Detected on CT Images:
FINDINGS: Non-cardiac: See separate report from [REDACTED].

Ascending aorta: Normal diameter 2.9 cm

Pericardium: Normal

Coronary arteries: No calcium noted
IMPRESSION: Coronary calcium score of 0.

Klever Jumper

*** End of Addendum ***

## 2020-05-24 DIAGNOSIS — K859 Acute pancreatitis without necrosis or infection, unspecified: Secondary | ICD-10-CM | POA: Diagnosis not present

## 2020-05-24 DIAGNOSIS — K760 Fatty (change of) liver, not elsewhere classified: Secondary | ICD-10-CM | POA: Diagnosis not present

## 2020-05-24 DIAGNOSIS — N83202 Unspecified ovarian cyst, left side: Secondary | ICD-10-CM | POA: Diagnosis not present

## 2020-05-24 DIAGNOSIS — R109 Unspecified abdominal pain: Secondary | ICD-10-CM | POA: Diagnosis not present

## 2020-05-24 DIAGNOSIS — R319 Hematuria, unspecified: Secondary | ICD-10-CM | POA: Diagnosis not present

## 2020-07-19 DIAGNOSIS — Z681 Body mass index (BMI) 19 or less, adult: Secondary | ICD-10-CM | POA: Diagnosis not present

## 2020-07-19 DIAGNOSIS — Z01419 Encounter for gynecological examination (general) (routine) without abnormal findings: Secondary | ICD-10-CM | POA: Diagnosis not present

## 2020-07-19 DIAGNOSIS — Z113 Encounter for screening for infections with a predominantly sexual mode of transmission: Secondary | ICD-10-CM | POA: Diagnosis not present

## 2020-07-19 DIAGNOSIS — Z1151 Encounter for screening for human papillomavirus (HPV): Secondary | ICD-10-CM | POA: Diagnosis not present

## 2020-07-19 DIAGNOSIS — Z1231 Encounter for screening mammogram for malignant neoplasm of breast: Secondary | ICD-10-CM | POA: Diagnosis not present

## 2020-07-19 DIAGNOSIS — Z124 Encounter for screening for malignant neoplasm of cervix: Secondary | ICD-10-CM | POA: Diagnosis not present

## 2020-07-25 DIAGNOSIS — Z1283 Encounter for screening for malignant neoplasm of skin: Secondary | ICD-10-CM | POA: Diagnosis not present

## 2020-07-25 DIAGNOSIS — D225 Melanocytic nevi of trunk: Secondary | ICD-10-CM | POA: Diagnosis not present

## 2020-07-25 DIAGNOSIS — D485 Neoplasm of uncertain behavior of skin: Secondary | ICD-10-CM | POA: Diagnosis not present

## 2020-10-01 DIAGNOSIS — M5412 Radiculopathy, cervical region: Secondary | ICD-10-CM | POA: Diagnosis not present

## 2020-10-07 DIAGNOSIS — M7711 Lateral epicondylitis, right elbow: Secondary | ICD-10-CM | POA: Diagnosis not present

## 2020-10-07 DIAGNOSIS — M25511 Pain in right shoulder: Secondary | ICD-10-CM | POA: Diagnosis not present

## 2020-10-07 DIAGNOSIS — M5412 Radiculopathy, cervical region: Secondary | ICD-10-CM | POA: Diagnosis not present

## 2020-10-17 DIAGNOSIS — M791 Myalgia, unspecified site: Secondary | ICD-10-CM | POA: Diagnosis not present

## 2020-11-28 DIAGNOSIS — Z20828 Contact with and (suspected) exposure to other viral communicable diseases: Secondary | ICD-10-CM | POA: Diagnosis not present

## 2020-12-01 DIAGNOSIS — Z20828 Contact with and (suspected) exposure to other viral communicable diseases: Secondary | ICD-10-CM | POA: Diagnosis not present

## 2020-12-05 DIAGNOSIS — Z20828 Contact with and (suspected) exposure to other viral communicable diseases: Secondary | ICD-10-CM | POA: Diagnosis not present

## 2020-12-08 DIAGNOSIS — Z20828 Contact with and (suspected) exposure to other viral communicable diseases: Secondary | ICD-10-CM | POA: Diagnosis not present

## 2020-12-12 DIAGNOSIS — Z20828 Contact with and (suspected) exposure to other viral communicable diseases: Secondary | ICD-10-CM | POA: Diagnosis not present

## 2020-12-15 DIAGNOSIS — Z20828 Contact with and (suspected) exposure to other viral communicable diseases: Secondary | ICD-10-CM | POA: Diagnosis not present

## 2020-12-19 DIAGNOSIS — Z20828 Contact with and (suspected) exposure to other viral communicable diseases: Secondary | ICD-10-CM | POA: Diagnosis not present

## 2020-12-22 DIAGNOSIS — Z20828 Contact with and (suspected) exposure to other viral communicable diseases: Secondary | ICD-10-CM | POA: Diagnosis not present

## 2020-12-26 DIAGNOSIS — Z20828 Contact with and (suspected) exposure to other viral communicable diseases: Secondary | ICD-10-CM | POA: Diagnosis not present

## 2020-12-29 DIAGNOSIS — Z20828 Contact with and (suspected) exposure to other viral communicable diseases: Secondary | ICD-10-CM | POA: Diagnosis not present

## 2021-01-02 DIAGNOSIS — Z20828 Contact with and (suspected) exposure to other viral communicable diseases: Secondary | ICD-10-CM | POA: Diagnosis not present

## 2021-01-09 DIAGNOSIS — Z20828 Contact with and (suspected) exposure to other viral communicable diseases: Secondary | ICD-10-CM | POA: Diagnosis not present

## 2021-01-11 DIAGNOSIS — Z20828 Contact with and (suspected) exposure to other viral communicable diseases: Secondary | ICD-10-CM | POA: Diagnosis not present

## 2021-01-16 DIAGNOSIS — Z20828 Contact with and (suspected) exposure to other viral communicable diseases: Secondary | ICD-10-CM | POA: Diagnosis not present

## 2021-01-19 DIAGNOSIS — Z20828 Contact with and (suspected) exposure to other viral communicable diseases: Secondary | ICD-10-CM | POA: Diagnosis not present

## 2021-01-23 DIAGNOSIS — Z20828 Contact with and (suspected) exposure to other viral communicable diseases: Secondary | ICD-10-CM | POA: Diagnosis not present

## 2021-01-26 DIAGNOSIS — Z20828 Contact with and (suspected) exposure to other viral communicable diseases: Secondary | ICD-10-CM | POA: Diagnosis not present

## 2021-01-30 DIAGNOSIS — Z20828 Contact with and (suspected) exposure to other viral communicable diseases: Secondary | ICD-10-CM | POA: Diagnosis not present

## 2021-02-02 DIAGNOSIS — Z20828 Contact with and (suspected) exposure to other viral communicable diseases: Secondary | ICD-10-CM | POA: Diagnosis not present

## 2021-02-06 DIAGNOSIS — Z20828 Contact with and (suspected) exposure to other viral communicable diseases: Secondary | ICD-10-CM | POA: Diagnosis not present

## 2021-02-09 DIAGNOSIS — Z20828 Contact with and (suspected) exposure to other viral communicable diseases: Secondary | ICD-10-CM | POA: Diagnosis not present

## 2021-02-20 DIAGNOSIS — Z20828 Contact with and (suspected) exposure to other viral communicable diseases: Secondary | ICD-10-CM | POA: Diagnosis not present

## 2021-03-06 DIAGNOSIS — Z20828 Contact with and (suspected) exposure to other viral communicable diseases: Secondary | ICD-10-CM | POA: Diagnosis not present

## 2021-03-09 DIAGNOSIS — Z20828 Contact with and (suspected) exposure to other viral communicable diseases: Secondary | ICD-10-CM | POA: Diagnosis not present

## 2021-03-13 DIAGNOSIS — Z20828 Contact with and (suspected) exposure to other viral communicable diseases: Secondary | ICD-10-CM | POA: Diagnosis not present

## 2021-03-16 DIAGNOSIS — Z20828 Contact with and (suspected) exposure to other viral communicable diseases: Secondary | ICD-10-CM | POA: Diagnosis not present

## 2021-03-20 DIAGNOSIS — Z20828 Contact with and (suspected) exposure to other viral communicable diseases: Secondary | ICD-10-CM | POA: Diagnosis not present

## 2021-03-23 DIAGNOSIS — Z20828 Contact with and (suspected) exposure to other viral communicable diseases: Secondary | ICD-10-CM | POA: Diagnosis not present

## 2021-03-27 DIAGNOSIS — Z20828 Contact with and (suspected) exposure to other viral communicable diseases: Secondary | ICD-10-CM | POA: Diagnosis not present

## 2022-01-01 ENCOUNTER — Emergency Department
Admission: EM | Admit: 2022-01-01 | Discharge: 2022-01-01 | Disposition: A | Discharge status: Home / Self Care | Payer: BC Managed Care – PPO | Attending: Student in an Organized Health Care Education/Training Program | Admitting: Student in an Organized Health Care Education/Training Program

## 2022-01-01 ENCOUNTER — Other Ambulatory Visit: Payer: Self-pay

## 2022-01-01 ENCOUNTER — Emergency Department: Payer: BC Managed Care – PPO

## 2022-01-01 DIAGNOSIS — M25562 Pain in left knee: Secondary | ICD-10-CM | POA: Diagnosis not present

## 2022-01-01 DIAGNOSIS — Z7982 Long term (current) use of aspirin: Secondary | ICD-10-CM | POA: Insufficient documentation

## 2022-01-01 DIAGNOSIS — M25561 Pain in right knee: Secondary | ICD-10-CM | POA: Insufficient documentation

## 2022-01-01 DIAGNOSIS — R519 Headache, unspecified: Secondary | ICD-10-CM | POA: Diagnosis not present

## 2022-01-01 DIAGNOSIS — M79601 Pain in right arm: Secondary | ICD-10-CM

## 2022-01-01 DIAGNOSIS — M79661 Pain in right lower leg: Secondary | ICD-10-CM | POA: Insufficient documentation

## 2022-01-01 DIAGNOSIS — M79621 Pain in right upper arm: Secondary | ICD-10-CM | POA: Diagnosis present

## 2022-01-01 DIAGNOSIS — W108XXA Fall (on) (from) other stairs and steps, initial encounter: Secondary | ICD-10-CM | POA: Diagnosis not present

## 2022-01-01 DIAGNOSIS — M79605 Pain in left leg: Secondary | ICD-10-CM

## 2022-01-01 DIAGNOSIS — M542 Cervicalgia: Secondary | ICD-10-CM | POA: Diagnosis not present

## 2022-01-01 DIAGNOSIS — Y92019 Unspecified place in single-family (private) house as the place of occurrence of the external cause: Secondary | ICD-10-CM | POA: Insufficient documentation

## 2022-01-01 DIAGNOSIS — Z23 Encounter for immunization: Secondary | ICD-10-CM | POA: Insufficient documentation

## 2022-01-01 DIAGNOSIS — W19XXXA Unspecified fall, initial encounter: Secondary | ICD-10-CM

## 2022-01-01 DIAGNOSIS — M79662 Pain in left lower leg: Secondary | ICD-10-CM | POA: Insufficient documentation

## 2022-01-01 DIAGNOSIS — M79604 Pain in right leg: Secondary | ICD-10-CM

## 2022-01-01 LAB — COMPREHENSIVE METABOLIC PANEL
ALT: 29 U/L (ref 0–44)
AST: 31 U/L (ref 15–41)
Albumin: 4.3 g/dL (ref 3.5–5.0)
Alkaline Phosphatase: 62 U/L (ref 38–126)
Anion gap: 13 (ref 5–15)
BUN: 7 mg/dL (ref 6–20)
CO2: 25 mmol/L (ref 22–32)
Calcium: 9 mg/dL (ref 8.9–10.3)
Chloride: 94 mmol/L — ABNORMAL LOW (ref 98–111)
Creatinine, Ser: 0.6 mg/dL (ref 0.44–1.00)
GFR, Estimated: >60 mL/min (ref >60)
Glucose, Bld: 95 mg/dL (ref 70–99)
Potassium: 4 mmol/L (ref 3.5–5.1)
Sodium: 132 mmol/L — ABNORMAL LOW (ref 135–145)
Total Bilirubin: 0.3 mg/dL (ref 0.3–1.2)
Total Protein: 7 g/dL (ref 6.5–8.1)

## 2022-01-01 LAB — TYPE AND SCREEN
ABO/RH(D): A POS
Antibody Screen: NEGATIVE

## 2022-01-01 LAB — CBC
HCT: 39.2 % (ref 36.0–46.0)
Hemoglobin: 13 g/dL (ref 12.0–15.0)
MCH: 31.8 pg (ref 26.0–34.0)
MCHC: 33.2 g/dL (ref 30.0–36.0)
MCV: 95.8 fL (ref 80.0–100.0)
Platelets: 292 10*3/uL (ref 150–400)
RBC: 4.09 MIL/uL (ref 3.87–5.11)
RDW: 12.5 % (ref 11.5–15.5)
WBC: 10 10*3/uL (ref 4.0–10.5)
nRBC: 0 % (ref 0.0–0.2)

## 2022-01-01 LAB — LIPASE, BLOOD: Lipase: 21 U/L (ref 11–51)

## 2022-01-01 MED ORDER — CYCLOBENZAPRINE HCL 5 MG PO TABS
5.0000 mg | ORAL_TABLET | Freq: Two times a day (BID) | ORAL | 0 refills | Status: DC | PRN
Start: 2022-01-01 — End: 2022-04-24

## 2022-01-01 MED ORDER — TETANUS-DIPHTH-ACELL PERTUSSIS 5-2.5-18.5 LF-MCG/0.5 IM SUSY
0.5000 mL | PREFILLED_SYRINGE | Freq: Once | INTRAMUSCULAR | Status: AC
  Administered 2022-01-01: 0.5 mL via INTRAMUSCULAR
  Filled 2022-01-01: qty 0.5

## 2022-01-01 MED ORDER — OXYCODONE-ACETAMINOPHEN 5-325 MG PO TABS
1.0000 | ORAL_TABLET | Freq: Once | ORAL | Status: DC
  Filled 2022-01-01: qty 1

## 2022-01-01 MED ORDER — HYDROCODONE-ACETAMINOPHEN 5-325 MG PO TABS: 1.0000 | ORAL_TABLET | ORAL | 0 refills | Status: DC | PRN

## 2022-01-01 NOTE — ED Triage Notes (Addendum)
Fall down the stairs at her home, slipped in socks. Fall from top down to bottom; approx ?20? Stairs. Reports flipping a few times and hitting head. Reports bilateral arm pain, right worse than left. Bilateral knee pain. Head, back and neck pain. Doesn't think she lost consciousness. Placed in wheelchair and taken back to room.

## 2022-01-01 NOTE — ED Notes (Signed)
Pt ambulated to the toilet and back, steady gait.

## 2022-01-01 NOTE — ED Notes (Signed)
Attempted to place PIV, stuck pt x 2, unsuccessful. Labs obtained, no line. MD informed.

## 2022-01-01 NOTE — ED Notes (Addendum)
error 

## 2022-01-01 NOTE — ED Provider Notes (Signed)
[facility] Provider Note    Event Date/Time   First MD Initiated Contact with Patient 01/01/22 [phone]     (approximate)   History   Fall   HPI  Olivia Turner is a 46 y.o. female with a history of anxiety and pancreatitis presents to the ER for evaluation of bilateral arm pain leg pain bilateral knee pain headache and neck pain after she fell down a flight of steps.  States that she slipped going down wooden steps.  Tumbling.  Did not lose consciousness.  Denies any abdominal pain no back pain no chest pain.  She does take daily aspirin.  States the pain is mild to moderate.     Physical Exam   Triage Vital Signs: ED Triage Vitals  Enc Vitals Group     BP 01/01/22 0908 (!) 124/91     Pulse Rate 01/01/22 0908 95     Resp 01/01/22 0908 16     Temp 01/01/22 0908 99.1 F (37.3 C)     Temp src --      SpO2 01/01/22 0908 95 %     Weight 01/01/22 0910 110 lb (49.9 kg)     Height 01/01/22 0910 5\' 5"  (1.651 m)     Head Circumference --      Peak Flow --      Pain Score 01/01/22 0909 8     Pain Loc --      Pain Edu? --      Excl. in GC? --     Most recent vital signs: Vitals:   01/01/22 0908 01/01/22 1152  BP: (!) 124/91 (!) 124/91  Pulse: 95 72  Resp: 16 16  Temp: 99.1 F (37.3 C)   SpO2: 95% 97%     Constitutional: Alert  Eyes: Conjunctivae are normal.  Head: Atraumatic. No hemotympanum Nose: No congestion/rhinnorhea. Mouth/Throat: Mucous membranes are moist.   Neck: Painless ROM.  Cardiovascular:   Good peripheral circulation. Respiratory: Normal respiratory effort.  No retractions.  Gastrointestinal: Soft and nontender in all four quadrants Musculoskeletal:  no deformity Neurologic:  good strength bilaterally upper and lower. Sensation intact. Able to ambulate without assistance.  MAE spontaneously. No gross focal neurologic deficits are appreciated.  Skin:  Skin is warm, dry and intact. No rash noted.  1cm superficial  laceration on dorsum of left hand dproximal to MCP, hemostatic Psychiatric: Mood and affect are normal. Speech and behavior are normal.    ED Results / Procedures / Treatments   Labs (all labs ordered are listed, but only abnormal results are displayed) Labs Reviewed  COMPREHENSIVE METABOLIC PANEL - Abnormal; Notable for the following components:      Result Value   Sodium 132 (*)    Chloride 94 (*)    All other components within normal limits  CBC  LIPASE, BLOOD  URINALYSIS, COMPLETE (UACMP) WITH MICROSCOPIC  TYPE AND SCREEN     EKG     RADIOLOGY Please see ED Course for my review and interpretation.  I personally reviewed all radiographic images ordered to evaluate for the above acute complaints and reviewed radiology reports and findings.  These findings were personally discussed with the patient.  Please see medical record for radiology report.    PROCEDURES:  Critical Care performed:   Procedures   MEDICATIONS ORDERED IN ED: Medications  oxyCODONE-acetaminophen (PERCOCET/ROXICET) 5-325 MG per tablet 1 tablet (1 tablet Oral Patient Refused/Not Given 01/01/22 0957)  Tdap (BOOSTRIX) injection 0.5 mL (0.5 mLs Intramuscular  Given 01/01/22 1156)     IMPRESSION / MDM / ASSESSMENT AND PLAN / ED COURSE  I reviewed the triage vital signs and the nursing notes.                              Differential diagnosis includes, but is not limited to, sah, sdh, edh, fracture, contusion, soft tissue injury, viscous injury, concussion, hemorrhage  Senting with pain as described above after fall down status.  Clinically she appears well and in slightly uncomfortable but hemodynamically stable.  X-rays as well as CT imaging will be ordered for above differential.  Patient declining any pain medication at this time states that she took some Motrin.   Clinical Course as of 01/01/22 1301  Mon Jan 01, 2022  1138 X-rays without acute abnormality no fracture.  No point tenderness  to the proximal left fibula.  She denies any new weakness.  Does have a history of "pinched nerve."  She not showing any signs of acute cord compression at this time.  We discussed option for MRI given CT cervical spine findings but patient declining at this time.  Also discussed my recommendation for Dermabond wound care to small laceration to the dorsum of the left hand patient states that she does not want stitches or dermabond. [PR]    Clinical Course User Index [PR] Willy Eddy, MD     FINAL CLINICAL IMPRESSION(S) / ED DIAGNOSES   Final diagnoses:  Fall, initial encounter  Pain of right upper extremity  Pain in both lower extremities     Rx / DC Orders   ED Discharge Orders          Ordered    HYDROcodone-acetaminophen (NORCO) 5-325 MG tablet  Every 4 hours PRN        01/01/22 1301    cyclobenzaprine (FLEXERIL) 5 MG tablet  2 times daily PRN        01/01/22 1301             Note:  This document was prepared using Dragon voice recognition software and may include unintentional dictation errors.    Willy Eddy, MD 01/01/22 [phone]

## 2022-01-01 NOTE — Discharge Instructions (Addendum)
IMPRESSION: No acute intracranial abnormalities.   Degenerative disc disease changes from C4-C5 through C6-C7.   Broad-based central disc protrusion at C5-C6, effacing CSF at ventral aspect of thecal sac and question slightly compressing the ventral aspect of the spinal cord RIGHT of midline.   No for cervical spine fracture or subluxation.

## 2022-02-02 ENCOUNTER — Other Ambulatory Visit: Payer: Self-pay | Admitting: Neurosurgery

## 2022-02-02 DIAGNOSIS — G959 Disease of spinal cord, unspecified: Secondary | ICD-10-CM

## 2022-02-07 ENCOUNTER — Ambulatory Visit
Admission: RE | Admit: 2022-02-07 | Discharge: 2022-02-07 | Disposition: A | Discharge status: Home / Self Care | Payer: BC Managed Care – PPO | Source: Ambulatory Visit | Attending: Neurosurgery | Admitting: Neurosurgery

## 2022-02-07 DIAGNOSIS — G959 Disease of spinal cord, unspecified: Secondary | ICD-10-CM

## 2022-03-27 ENCOUNTER — Ambulatory Visit (INDEPENDENT_AMBULATORY_CARE_PROVIDER_SITE_OTHER): Payer: BC Managed Care – PPO

## 2022-03-27 ENCOUNTER — Ambulatory Visit: Payer: BC Managed Care – PPO | Admitting: Podiatry

## 2022-03-27 DIAGNOSIS — L84 Corns and callosities: Secondary | ICD-10-CM | POA: Diagnosis not present

## 2022-03-27 DIAGNOSIS — M21621 Bunionette of right foot: Secondary | ICD-10-CM | POA: Diagnosis not present

## 2022-03-27 DIAGNOSIS — M21622 Bunionette of left foot: Secondary | ICD-10-CM

## 2022-03-27 NOTE — Progress Notes (Signed)
Subjective:  Patient ID: Olivia Turner, female    DOB: November 05, 1976,  MRN: [id number]  No chief complaint on file.   46 y.o. female presents with the above complaint.  Patient presents with concern for hyperkeratotic lesions and is alone normally between fourth and fifth digit.  Is painful to touch is progressive gotten worse.  She wanted to get it evaluated.  Hurts with ambulation and hurts with working on her feet.  She had known a podiatrist for a long time in [place] who was helping her take care of it however he has passed away and is visiting care over here.  She states that debriding it helps.  It is not as bad today.  She wanted to get it discuss options   Review of Systems: Negative except as noted in the HPI. Denies N/V/F/Ch.  Past Medical History:  Diagnosis Date   Anxiety    GERD (gastroesophageal reflux disease)    Pancreatitis     Current Outpatient Medications:    aspirin 81 MG chewable tablet, Chew 81 mg by mouth daily., Disp: , Rfl:    BIOTIN PO, Take 5,000 mg by mouth at bedtime., Disp: , Rfl:    Cyanocobalamin (VITAMIN B-12 IJ), Inject 1,200 mg as directed once a week. , Disp: , Rfl:    cyclobenzaprine (FLEXERIL) 5 MG tablet, Take 1 tablet (5 mg total) by mouth 2 (two) times daily as needed for muscle spasms., Disp: 12 tablet, Rfl: 0   Emollient (COLLAGEN EX), Take 6,000 mg by mouth daily., Disp: , Rfl:    FLAXSEED, LINSEED, PO, Take 1 tablet by mouth at bedtime. , Disp: , Rfl:    HYDROcodone-acetaminophen (NORCO) 5-325 MG tablet, Take 1 tablet by mouth every 4 (four) hours as needed for moderate pain., Disp: 6 tablet, Rfl: 0   levonorgestrel (MIRENA, 52 MG,) 20 MCG/24HR IUD, 1 each by Intrauterine route. , Disp: , Rfl:    Multiple Vitamin (MULTI-VITAMIN DAILY PO), Take 1 tablet by mouth daily. , Disp: , Rfl:    omeprazole (PRILOSEC) 20 MG capsule, Take 20 mg by mouth daily., Disp: , Rfl:    Probiotic Product (PROBIOTIC DAILY PO), Take 5 mLs by  mouth daily. Powder to mix with water, Disp: , Rfl:   Social History   Tobacco Use  Smoking Status Every Day   Packs/day: 1.00   Years: 20.00   Pack years: 20.00   Types: Cigarettes  Smokeless Tobacco Never    Allergies  Allergen Reactions   Penicillins Other (See Comments)    Did it involve swelling of the face/tongue/throat, SOB, or low BP? Unknown Did it involve sudden or severe rash/hives, skin peeling, or any reaction on the inside of your mouth or nose? Unknown Did you need to seek medical attention at a hospital or doctor's office? Unknown When did it last happen?      Childhood If all above answers are "NO", may proceed with cephalosporin use.   Sulfa Antibiotics Swelling   Cephalosporins Swelling and Rash   Objective:  There were no vitals filed for this visit. There is no height or weight on file to calculate BMI. Constitutional Well developed. Well nourished.  Vascular Dorsalis pedis pulses palpable bilaterally. Posterior tibial pulses palpable bilaterally. Capillary refill normal to all digits.  No cyanosis or clubbing noted. Pedal hair growth normal.  Neurologic Normal speech. Oriented to person, place, and time. Epicritic sensation to light touch grossly present bilaterally.  Dermatologic Hyperkeratotic lesion with central nucleated core  noted to right fifth digit.  Hammertoe contracture fifth and fourth digit noted semiflexible on.  Nature.  Heloma molle noted in between the toes.  No signs of open ulceration noted.  Orthopedic: Normal joint ROM without pain or crepitus bilaterally. No visible deformities. No bony tenderness.   Radiographs: 3 views of skeletally mature the right foot: Tailor's bunion noted with increasing lateral deviation angle as well as increase in intermetatarsal angle.  Bunion deformity noted with moderate bunion deformity sesamoid position of out of 7.  Hammertoe contracture fourth and fifth digit noted. Assessment:   1. Tailor's  bunion of both feet   2. Heloma molle    Plan:  Patient was evaluated and treated and all questions answered.  Right fourth and fifth digit heloma molle with underlying tailor's bunion and hammertoe contracture fourth and fifth digit -All questions and concerns were discussed with the patient in extensive detail -Using chisel blade handle the lesion was debrided down to healthy striated tissue no complication noted.  No pinpoint bleeding noted -Spacers and protectors were dispensed. -I discussed shoe gear modification in extensive detail as well. -If there is no improvement we will discuss surgical options during next clinical visit.  She states understanding.  No follow-ups on file.

## 2022-04-03 ENCOUNTER — Other Ambulatory Visit: Payer: Self-pay | Admitting: Neurosurgery

## 2022-04-03 DIAGNOSIS — Z01818 Encounter for other preprocedural examination: Secondary | ICD-10-CM

## 2022-04-09 ENCOUNTER — Inpatient Hospital Stay: Admission: RE | Admit: 2022-04-09 | Payer: BC Managed Care – PPO | Source: Ambulatory Visit

## 2022-04-12 ENCOUNTER — Other Ambulatory Visit: Payer: Self-pay

## 2022-04-12 ENCOUNTER — Encounter
Admission: RE | Admit: 2022-04-12 | Discharge: 2022-04-12 | Disposition: A | Discharge status: Home / Self Care | Payer: BC Managed Care – PPO | Source: Ambulatory Visit | Attending: Neurosurgery | Admitting: Neurosurgery

## 2022-04-12 VITALS — BP 134/87 | Resp 16 | Ht 65.0 in | Wt 117.5 lb

## 2022-04-12 DIAGNOSIS — Z01818 Encounter for other preprocedural examination: Secondary | ICD-10-CM | POA: Insufficient documentation

## 2022-04-12 DIAGNOSIS — I479 Paroxysmal tachycardia, unspecified: Secondary | ICD-10-CM | POA: Diagnosis not present

## 2022-04-12 DIAGNOSIS — Z01810 Encounter for preprocedural cardiovascular examination: Secondary | ICD-10-CM

## 2022-04-12 DIAGNOSIS — Z01812 Encounter for preprocedural laboratory examination: Secondary | ICD-10-CM

## 2022-04-12 HISTORY — DX: Pneumonia, unspecified organism: J18.9

## 2022-04-12 HISTORY — DX: Unspecified osteoarthritis, unspecified site: M19.90

## 2022-04-12 HISTORY — DX: Essential (primary) hypertension: I10

## 2022-04-12 HISTORY — DX: Cardiac arrhythmia, unspecified: I49.9

## 2022-04-12 HISTORY — DX: Anemia, unspecified: D64.9

## 2022-04-12 LAB — CBC
HCT: 39 % (ref 36.0–46.0)
Hemoglobin: 13.3 g/dL (ref 12.0–15.0)
MCH: 32.1 pg (ref 26.0–34.0)
MCHC: 34.1 g/dL (ref 30.0–36.0)
MCV: 94.2 fL (ref 80.0–100.0)
Platelets: 302 10*3/uL (ref 150–400)
RBC: 4.14 MIL/uL (ref 3.87–5.11)
RDW: 12.2 % (ref 11.5–15.5)
WBC: 7.6 10*3/uL (ref 4.0–10.5)
nRBC: 0 % (ref 0.0–0.2)

## 2022-04-12 LAB — BASIC METABOLIC PANEL
Anion gap: 6 (ref 5–15)
BUN: 8 mg/dL (ref 6–20)
CO2: 27 mmol/L (ref 22–32)
Calcium: 9.7 mg/dL (ref 8.9–10.3)
Chloride: 101 mmol/L (ref 98–111)
Creatinine, Ser: 0.89 mg/dL (ref 0.44–1.00)
GFR, Estimated: >60 mL/min (ref >60)
Glucose, Bld: 118 mg/dL — ABNORMAL HIGH (ref 70–99)
Potassium: 4.3 mmol/L (ref 3.5–5.1)
Sodium: 134 mmol/L — ABNORMAL LOW (ref 135–145)

## 2022-04-12 LAB — SURGICAL PCR SCREEN
MRSA, PCR: POSITIVE — AB
Staphylococcus aureus: POSITIVE — AB

## 2022-04-12 LAB — TYPE AND SCREEN
ABO/RH(D): A POS
Antibody Screen: NEGATIVE

## 2022-04-12 NOTE — Progress Notes (Signed)
  Perioperative Services  Abnormal Lab Notification and Treatment Plan of Care  Date: 04/12/22  Name: Olivia Turner MRN:   [id number]  Re: Abnormal labs noted during PAT appointment  Provider Notified: Venetia Night, MD Notification mode: Routed and/or faxed via [facility]  Labs of concern: Lab Results  Component Value Date   STAPHAUREUS POSITIVE (A) 04/12/2022   MRSAPCR POSITIVE (A) 04/12/2022    Notes: Patient is scheduled for a C6 CORPECTOMY, C5-7 INSTRUMENTATION on 04/23/2022. She is scheduled to receive CEFAZOLIN pre-operatively. Surgical PCR (+) for MRSA; see above.  PLANS:  Review renal function. Estimated Creatinine Clearance: 65.8 mL/min (by C-G formula based on SCr of 0.89 mg/dL). Review allergies. No documented allergy to vancomycin. Order added for VANCOMYCIN 1 GRAM IV to current preoperative prophylactic regimen.  Patient with orders for both CEFAZOLIN + VANCOMYCIN to be given in the setting of documented MRSA (+) surgical PCR.   Guidelines suggest that a beta-lactam antibiotic (first or second generation cephalosporin) should be added for activity against gram-negative organisms.  Vancomycin appears to be less effective than cefazolin for preventing SSIs caused by MSSA. For this reason, the use of vancomycin in combination with cefazolin is favored for prevention of SSI due to MRSA and coagulase-negative staphylococci.  Notify primary attending surgeon of (+) MRSA result and that order has been placed for the Vancomycin by perioperative APP.   Quentin Mulling, MSN, APRN, FNP-C, CEN [facility]  Peri-operative Services Nurse Practitioner Phone: [phone] 04/12/22 4:02 PM

## 2022-04-12 NOTE — Patient Instructions (Addendum)
Your procedure is scheduled on: 04/23/22 - Monday Report to the Registration Desk on the 1st floor of the [facility]. To find out your arrival time, please call [phone] between 1PM - 3PM on: 04/20/22 - Friday If your arrival time is 6:00 am, do not arrive prior to that time as the [facility] entrance doors do not open until 6:00 am.  REMEMBER: Instructions that are not followed completely may result in serious medical risk, up to and including death; or upon the discretion of your surgeon and anesthesiologist your surgery may need to be rescheduled.  Do not eat food after midnight the night before surgery.  No gum chewing, lozengers or hard candies.  You may however, drink CLEAR liquids up to 2 hours before you are scheduled to arrive for your surgery. Do not drink anything within 2 hours of your scheduled arrival time.  Clear liquids include: - water  - apple juice without pulp - gatorade (not RED colors) - black coffee or tea (Do NOT add milk or creamers to the coffee or tea) Do NOT drink anything that is not on this list.   TAKE THESE MEDICATIONS THE MORNING OF SURGERY WITH A SIP OF WATER:  - omeprazole (PRILOSEC) 20 MG capsule, (take one the night before and one on the morning of surgery - helps to prevent nausea after surgery.)   Stop taking beginning 04/15/22, no NSAIDS  for 3 months after surgery Stop Anti-inflammatories (NSAIDS) such as Advil, Aleve, Ibuprofen, Motrin, Naproxen, Naprosyn and Aspirin based products such as Excedrin, Goodys Powder, BC Powder, meloxicam (MOBIC) 15 MG tablet.  Stop beginning 04/15/22, ANY OVER THE COUNTER supplements until after surgery.BIOTIN PO, Emollient (COLLAGEN ), FLAXSEED, LINSEED,Multiple Vitamin (MULTI-VITAMIN , vitamin C.  You may however, continue to take Tylenol if needed for pain up until the day of surgery.  No Alcohol for 24 hours before or after surgery.  No Smoking including e-cigarettes for 24 hours prior to surgery.   No chewable tobacco products for at least 6 hours prior to surgery.  No nicotine patches on the day of surgery.  Do not use any "recreational" drugs for at least a week prior to your surgery.  Please be advised that the combination of cocaine and anesthesia may have negative outcomes, up to and including death. If you test positive for cocaine, your surgery will be cancelled.  On the morning of surgery brush your teeth with toothpaste and water, you may rinse your mouth with mouthwash if you wish. Do not swallow any toothpaste or mouthwash.  Use CHG Soap or wipes as directed on instruction sheet.  Do not wear jewelry, make-up, hairpins, clips or nail polish.  Do not wear lotions, powders, or perfumes.   Do not shave body from the neck down 48 hours prior to surgery just in case you cut yourself which could leave a site for infection.  Also, freshly shaved skin may become irritated if using the CHG soap.  Contact lenses, hearing aids and dentures may not be worn into surgery.  Do not bring valuables to the hospital. [facility] is not responsible for any missing/lost belongings or valuables.   Notify your doctor if there is any change in your medical condition (cold, fever, infection).  Wear comfortable clothing (specific to your surgery type) to the hospital.  After surgery, you can help prevent lung complications by doing breathing exercises.  Take deep breaths and cough every 1-2 hours. Your doctor may order a device called an Incentive  Spirometer to help you take deep breaths. When coughing or sneezing, hold a pillow firmly against your incision with both hands. This is called "splinting." Doing this helps protect your incision. It also decreases belly discomfort.  If you are being admitted to the hospital overnight, leave your suitcase in the car. After surgery it may be brought to your room.  If you are being discharged the day of surgery, you will not be allowed to drive  home. You will need a responsible adult (18 years or older) to drive you home and stay with you that night.   If you are taking public transportation, you will need to have a responsible adult (18 years or older) with you. Please confirm with your physician that it is acceptable to use public transportation.   Please call the [facility] Dept. at [phone] if you have any questions about these instructions.  Surgery Visitation Policy:  Patients undergoing a surgery or procedure may have two family members or support persons with them as long as the person is not COVID-19 positive or experiencing its symptoms.   Inpatient Visitation:    Visiting hours are 7 a.m. to 8 p.m. Up to four visitors are allowed at one time in a patient room, including children. The visitors may rotate out with other people during the day. One designated support person (adult) may remain overnight.

## 2022-04-23 ENCOUNTER — Ambulatory Visit: Payer: BC Managed Care – PPO | Admitting: Urgent Care

## 2022-04-23 ENCOUNTER — Other Ambulatory Visit: Payer: Self-pay

## 2022-04-23 ENCOUNTER — Encounter
Admission: RE | Disposition: A | Discharge status: Home / Self Care | Payer: Self-pay | Source: Home / Self Care | Attending: Neurosurgery

## 2022-04-23 ENCOUNTER — Ambulatory Visit: Payer: BC Managed Care – PPO

## 2022-04-23 ENCOUNTER — Observation Stay
Admission: RE | Admit: 2022-04-23 | Discharge: 2022-04-24 | Disposition: A | Discharge status: Home / Self Care | Payer: BC Managed Care – PPO | Attending: Neurosurgery | Admitting: Neurosurgery

## 2022-04-23 ENCOUNTER — Encounter: Payer: Self-pay | Admitting: Neurosurgery

## 2022-04-23 DIAGNOSIS — M4802 Spinal stenosis, cervical region: Secondary | ICD-10-CM | POA: Insufficient documentation

## 2022-04-23 DIAGNOSIS — Z981 Arthrodesis status: Secondary | ICD-10-CM

## 2022-04-23 DIAGNOSIS — Z01812 Encounter for preprocedural laboratory examination: Secondary | ICD-10-CM

## 2022-04-23 DIAGNOSIS — M4712 Other spondylosis with myelopathy, cervical region: Principal | ICD-10-CM | POA: Insufficient documentation

## 2022-04-23 HISTORY — PX: ANTERIOR CERVICAL CORPECTOMY: SHX1159

## 2022-04-23 LAB — POCT PREGNANCY, URINE: Preg Test, Ur: NEGATIVE

## 2022-04-23 SURGERY — ANTERIOR CERVICAL CORPECTOMY
Anesthesia: General

## 2022-04-23 MED ORDER — SURGIFLO WITH THROMBIN (HEMOSTATIC MATRIX KIT) OPTIME
TOPICAL | Status: DC | PRN
  Administered 2022-04-23: 1 via TOPICAL

## 2022-04-23 MED ORDER — ORAL CARE MOUTH RINSE
15.0000 mL | Freq: Once | OROMUCOSAL | Status: AC
Start: 2022-04-23 — End: 2022-04-23

## 2022-04-23 MED ORDER — POLYETHYLENE GLYCOL 3350 17 G PO PACK: 17.0000 g | PACK | Freq: Every day | ORAL | Status: DC | PRN

## 2022-04-23 MED ORDER — 0.9 % SODIUM CHLORIDE (POUR BTL) OPTIME
TOPICAL | Status: DC | PRN
  Administered 2022-04-23: 1000 mL

## 2022-04-23 MED ORDER — SODIUM CHLORIDE 0.9% FLUSH: 3.0000 mL | INTRAVENOUS | Status: DC | PRN

## 2022-04-23 MED ORDER — DOCUSATE SODIUM 100 MG PO CAPS
100.0000 mg | ORAL_CAPSULE | Freq: Two times a day (BID) | ORAL | Status: DC
  Administered 2022-04-23: 100 mg via ORAL
  Filled 2022-04-23: qty 1

## 2022-04-23 MED ORDER — FENTANYL CITRATE (PF) 100 MCG/2ML IJ SOLN
INTRAMUSCULAR | Status: AC
  Administered 2022-04-23: 25 ug via INTRAVENOUS
  Filled 2022-04-23: qty 2

## 2022-04-23 MED ORDER — DEXAMETHASONE SODIUM PHOSPHATE 10 MG/ML IJ SOLN
INTRAMUSCULAR | Status: AC
  Filled 2022-04-23: qty 1

## 2022-04-23 MED ORDER — MIDAZOLAM HCL 2 MG/2ML IJ SOLN
INTRAMUSCULAR | Status: AC
  Filled 2022-04-23: qty 2

## 2022-04-23 MED ORDER — METHOCARBAMOL 1000 MG/10ML IJ SOLN
500.0000 mg | Freq: Four times a day (QID) | INTRAVENOUS | Status: DC | PRN
  Administered 2022-04-23: 500 mg via INTRAVENOUS
  Filled 2022-04-23: qty 500

## 2022-04-23 MED ORDER — ASCORBIC ACID 500 MG PO TABS: 1000.0000 mg | ORAL_TABLET | Freq: Every day | ORAL | Status: DC

## 2022-04-23 MED ORDER — FAMOTIDINE 20 MG PO TABS: 20.0000 mg | ORAL_TABLET | Freq: Once | ORAL | Status: AC

## 2022-04-23 MED ORDER — SODIUM CHLORIDE 0.9 % IV SOLN
INTRAVENOUS | Status: DC | PRN
  Administered 2022-04-23: .05 ug/kg/min via INTRAVENOUS

## 2022-04-23 MED ORDER — LOSARTAN POTASSIUM 25 MG PO TABS
25.0000 mg | ORAL_TABLET | Freq: Every day | ORAL | Status: DC
  Administered 2022-04-23: 25 mg via ORAL
  Filled 2022-04-23: qty 1

## 2022-04-23 MED ORDER — DEXAMETHASONE SODIUM PHOSPHATE 10 MG/ML IJ SOLN
INTRAMUSCULAR | Status: DC | PRN
  Administered 2022-04-23: 10 mg via INTRAVENOUS

## 2022-04-23 MED ORDER — PHENYLEPHRINE HCL (PRESSORS) 10 MG/ML IV SOLN
INTRAVENOUS | Status: AC
  Filled 2022-04-23: qty 1

## 2022-04-23 MED ORDER — FENTANYL CITRATE (PF) 100 MCG/2ML IJ SOLN
25.0000 ug | INTRAMUSCULAR | Status: DC | PRN
  Administered 2022-04-23: 50 ug via INTRAVENOUS

## 2022-04-23 MED ORDER — EPHEDRINE 5 MG/ML INJ
INTRAVENOUS | Status: AC
  Filled 2022-04-23: qty 5

## 2022-04-23 MED ORDER — KETOROLAC TROMETHAMINE 15 MG/ML IJ SOLN
15.0000 mg | Freq: Four times a day (QID) | INTRAMUSCULAR | Status: AC
  Administered 2022-04-23 – 2022-04-24 (×4): 15 mg via INTRAVENOUS
  Filled 2022-04-23 (×4): qty 1

## 2022-04-23 MED ORDER — ENOXAPARIN SODIUM 40 MG/0.4ML IJ SOSY: 40.0000 mg | PREFILLED_SYRINGE | INTRAMUSCULAR | Status: DC

## 2022-04-23 MED ORDER — PHENYLEPHRINE HCL-NACL 20-0.9 MG/250ML-% IV SOLN
INTRAVENOUS | Status: DC | PRN
  Administered 2022-04-23: 25 ug/min via INTRAVENOUS

## 2022-04-23 MED ORDER — ONDANSETRON HCL 4 MG/2ML IJ SOLN
INTRAMUSCULAR | Status: AC
  Filled 2022-04-23: qty 2

## 2022-04-23 MED ORDER — PHENYLEPHRINE 80 MCG/ML (10ML) SYRINGE FOR IV PUSH (FOR BLOOD PRESSURE SUPPORT)
PREFILLED_SYRINGE | INTRAVENOUS | Status: AC
Start: 2022-04-23 — End: ?
  Filled 2022-04-23: qty 10

## 2022-04-23 MED ORDER — CHLORHEXIDINE GLUCONATE 0.12 % MT SOLN
OROMUCOSAL | Status: AC
  Administered 2022-04-23: 15 mL via OROMUCOSAL
  Filled 2022-04-23: qty 15

## 2022-04-23 MED ORDER — PROPOFOL 1000 MG/100ML IV EMUL
INTRAVENOUS | Status: AC
Start: 2022-04-23 — End: ?
  Filled 2022-04-23: qty 100

## 2022-04-23 MED ORDER — HYDROMORPHONE HCL 2 MG PO TABS: 1.0000 mg | ORAL_TABLET | ORAL | Status: DC | PRN

## 2022-04-23 MED ORDER — SUCCINYLCHOLINE CHLORIDE 200 MG/10ML IV SOSY
PREFILLED_SYRINGE | INTRAVENOUS | Status: AC
  Filled 2022-04-23: qty 10

## 2022-04-23 MED ORDER — VANCOMYCIN HCL IN DEXTROSE 1-5 GM/200ML-% IV SOLN
1000.0000 mg | Freq: Once | INTRAVENOUS | Status: AC
  Administered 2022-04-23: 1000 mg via INTRAVENOUS

## 2022-04-23 MED ORDER — PROMETHAZINE HCL 25 MG/ML IJ SOLN: 6.2500 mg | INTRAMUSCULAR | Status: DC | PRN

## 2022-04-23 MED ORDER — CHLORHEXIDINE GLUCONATE 0.12 % MT SOLN: 15.0000 mL | Freq: Once | OROMUCOSAL | Status: AC

## 2022-04-23 MED ORDER — MENTHOL 3 MG MT LOZG: 1.0000 | LOZENGE | OROMUCOSAL | Status: DC | PRN

## 2022-04-23 MED ORDER — SUCCINYLCHOLINE CHLORIDE 200 MG/10ML IV SOSY
PREFILLED_SYRINGE | INTRAVENOUS | Status: DC | PRN
  Administered 2022-04-23: 80 mg via INTRAVENOUS

## 2022-04-23 MED ORDER — ACETAMINOPHEN 500 MG PO TABS
1000.0000 mg | ORAL_TABLET | Freq: Four times a day (QID) | ORAL | Status: AC
  Administered 2022-04-23 – 2022-04-24 (×4): 1000 mg via ORAL
  Filled 2022-04-23 (×4): qty 2

## 2022-04-23 MED ORDER — FENTANYL CITRATE (PF) 100 MCG/2ML IJ SOLN
INTRAMUSCULAR | Status: AC
  Filled 2022-04-23: qty 2

## 2022-04-23 MED ORDER — REMIFENTANIL HCL 1 MG IV SOLR
INTRAVENOUS | Status: AC
  Filled 2022-04-23: qty 1000

## 2022-04-23 MED ORDER — ONDANSETRON HCL 4 MG/2ML IJ SOLN
INTRAMUSCULAR | Status: DC | PRN
  Administered 2022-04-23: 4 mg via INTRAVENOUS

## 2022-04-23 MED ORDER — ESCITALOPRAM OXALATE 10 MG PO TABS: 20.0000 mg | ORAL_TABLET | Freq: Every day | ORAL | Status: DC

## 2022-04-23 MED ORDER — SODIUM CHLORIDE 0.9% FLUSH
3.0000 mL | Freq: Two times a day (BID) | INTRAVENOUS | Status: DC
  Administered 2022-04-23 (×2): 3 mL via INTRAVENOUS

## 2022-04-23 MED ORDER — PROPOFOL 10 MG/ML IV BOLUS
INTRAVENOUS | Status: AC
  Filled 2022-04-23: qty 20

## 2022-04-23 MED ORDER — PROPOFOL 1000 MG/100ML IV EMUL
INTRAVENOUS | Status: AC
  Filled 2022-04-23: qty 100

## 2022-04-23 MED ORDER — HYDROMORPHONE HCL 1 MG/ML IJ SOLN: 0.5000 mg | INTRAMUSCULAR | Status: DC | PRN

## 2022-04-23 MED ORDER — SODIUM CHLORIDE 0.9 % IV SOLN: INTRAVENOUS | Status: DC

## 2022-04-23 MED ORDER — METHOCARBAMOL 500 MG PO TABS: 500.0000 mg | ORAL_TABLET | Freq: Four times a day (QID) | ORAL | Status: DC | PRN

## 2022-04-23 MED ORDER — EPHEDRINE SULFATE (PRESSORS) 50 MG/ML IJ SOLN
INTRAMUSCULAR | Status: DC | PRN
  Administered 2022-04-23 (×2): 5 mg via INTRAVENOUS

## 2022-04-23 MED ORDER — CEFAZOLIN SODIUM-DEXTROSE 2-4 GM/100ML-% IV SOLN
2.0000 g | INTRAVENOUS | Status: AC
  Administered 2022-04-23: 2 g via INTRAVENOUS

## 2022-04-23 MED ORDER — BUPIVACAINE-EPINEPHRINE (PF) 0.5% -1:200000 IJ SOLN
INTRAMUSCULAR | Status: DC | PRN
  Administered 2022-04-23: 6 mL via PERINEURAL

## 2022-04-23 MED ORDER — FENTANYL CITRATE (PF) 100 MCG/2ML IJ SOLN
INTRAMUSCULAR | Status: DC | PRN
Start: 2022-04-23 — End: 2022-04-23
  Administered 2022-04-23 (×2): 50 ug via INTRAVENOUS

## 2022-04-23 MED ORDER — BUPIVACAINE-EPINEPHRINE (PF) 0.5% -1:200000 IJ SOLN
INTRAMUSCULAR | Status: AC
Start: 2022-04-23 — End: ?
  Filled 2022-04-23: qty 30

## 2022-04-23 MED ORDER — HYDROMORPHONE HCL 2 MG PO TABS: 2.0000 mg | ORAL_TABLET | ORAL | Status: DC | PRN

## 2022-04-23 MED ORDER — VANCOMYCIN HCL IN DEXTROSE 1-5 GM/200ML-% IV SOLN
INTRAVENOUS | Status: AC
  Filled 2022-04-23: qty 200

## 2022-04-23 MED ORDER — EPHEDRINE SULFATE (PRESSORS) 50 MG/ML IJ SOLN
INTRAMUSCULAR | Status: AC
  Filled 2022-04-23: qty 2

## 2022-04-23 MED ORDER — PROPOFOL 500 MG/50ML IV EMUL
INTRAVENOUS | Status: DC | PRN
  Administered 2022-04-23: 150 ug/kg/min via INTRAVENOUS

## 2022-04-23 MED ORDER — ONDANSETRON HCL 4 MG PO TABS: 4.0000 mg | ORAL_TABLET | Freq: Four times a day (QID) | ORAL | Status: DC | PRN

## 2022-04-23 MED ORDER — FLEET ENEMA 7-19 GM/118ML RE ENEM: 1.0000 | ENEMA | Freq: Once | RECTAL | Status: DC | PRN

## 2022-04-23 MED ORDER — LACTATED RINGERS IV SOLN: INTRAVENOUS | Status: DC

## 2022-04-23 MED ORDER — BISACODYL 10 MG RE SUPP: 10.0000 mg | Freq: Every day | RECTAL | Status: DC | PRN

## 2022-04-23 MED ORDER — MIDAZOLAM HCL 2 MG/2ML IJ SOLN
INTRAMUSCULAR | Status: DC | PRN
  Administered 2022-04-23: 2 mg via INTRAVENOUS

## 2022-04-23 MED ORDER — LIDOCAINE HCL (CARDIAC) PF 100 MG/5ML IV SOSY
PREFILLED_SYRINGE | INTRAVENOUS | Status: DC | PRN
  Administered 2022-04-23: 50 mg via INTRAVENOUS

## 2022-04-23 MED ORDER — PROPOFOL 10 MG/ML IV BOLUS
INTRAVENOUS | Status: DC | PRN
  Administered 2022-04-23: 110 mg via INTRAVENOUS
  Administered 2022-04-23: 25 mg via INTRAVENOUS

## 2022-04-23 MED ORDER — PHENOL 1.4 % MT LIQD
1.0000 | OROMUCOSAL | Status: DC | PRN
Start: 2022-04-23 — End: 2022-04-24

## 2022-04-23 MED ORDER — SODIUM CHLORIDE 0.9 % IV SOLN: 250.0000 mL | INTRAVENOUS | Status: DC

## 2022-04-23 MED ORDER — PANTOPRAZOLE SODIUM 40 MG PO TBEC: 40.0000 mg | DELAYED_RELEASE_TABLET | Freq: Every day | ORAL | Status: DC

## 2022-04-23 MED ORDER — LIDOCAINE HCL (PF) 2 % IJ SOLN
INTRAMUSCULAR | Status: AC
  Filled 2022-04-23: qty 5

## 2022-04-23 MED ORDER — FAMOTIDINE 20 MG PO TABS
ORAL_TABLET | ORAL | Status: AC
  Administered 2022-04-23: 20 mg via ORAL
  Filled 2022-04-23: qty 1

## 2022-04-23 MED ORDER — ONDANSETRON HCL 4 MG/2ML IJ SOLN: 4.0000 mg | Freq: Four times a day (QID) | INTRAMUSCULAR | Status: DC | PRN

## 2022-04-23 MED ORDER — CEFAZOLIN SODIUM-DEXTROSE 2-4 GM/100ML-% IV SOLN
INTRAVENOUS | Status: AC
  Filled 2022-04-23: qty 100

## 2022-04-23 MED ORDER — SENNA 8.6 MG PO TABS
1.0000 | ORAL_TABLET | Freq: Two times a day (BID) | ORAL | Status: DC
  Filled 2022-04-23: qty 1

## 2022-04-23 MED ORDER — ROCURONIUM BROMIDE 10 MG/ML (PF) SYRINGE
PREFILLED_SYRINGE | INTRAVENOUS | Status: AC
  Filled 2022-04-23: qty 10

## 2022-04-23 SURGICAL SUPPLY — 68 items
BASIN KIT SINGLE STR (MISCELLANEOUS) ×2 IMPLANT
BASKET BONE COLLECTION (BASKET) ×1 IMPLANT
BULB RESERV EVAC DRAIN JP 100C (MISCELLANEOUS) IMPLANT
BUR NEURO DRILL SOFT 3.0X3.8M (BURR) ×2 IMPLANT
CHLORAPREP W/TINT 26 (MISCELLANEOUS) ×4 IMPLANT
COUNTER NEEDLE 20/40 LG (NEEDLE) ×2 IMPLANT
CUP MEDICINE 2OZ PLAST GRAD ST (MISCELLANEOUS) ×2 IMPLANT
DERMABOND ADVANCED (GAUZE/BANDAGES/DRESSINGS) ×1
DERMABOND ADVANCED .7 DNX12 (GAUZE/BANDAGES/DRESSINGS) ×1 IMPLANT
DRAIN CHANNEL JP 10F RND 20C F (MISCELLANEOUS) IMPLANT
DRAPE C ARM PK CFD 31 SPINE (DRAPES) ×2 IMPLANT
DRAPE LAPAROTOMY 77X122 PED (DRAPES) ×2 IMPLANT
DRAPE MICROSCOPE SPINE 48X150 (DRAPES) ×2 IMPLANT
DRAPE SURG 17X11 SM STRL (DRAPES) ×2 IMPLANT
ELECT CAUTERY BLADE TIP 2.5 (TIP) ×2
ELECT REM PT RETURN 9FT ADLT (ELECTROSURGICAL) ×2
ELECTRODE CAUTERY BLDE TIP 2.5 (TIP) ×1 IMPLANT
ELECTRODE REM PT RTRN 9FT ADLT (ELECTROSURGICAL) ×1 IMPLANT
FEE INTRAOP CADWELL SUPPLY NCS (MISCELLANEOUS) IMPLANT
FEE INTRAOP MONITOR IMPULS NCS (MISCELLANEOUS) IMPLANT
GLOVE BIOGEL PI IND STRL 6.5 (GLOVE) ×1 IMPLANT
GLOVE BIOGEL PI IND STRL 8.5 (GLOVE) ×1 IMPLANT
GLOVE BIOGEL PI INDICATOR 6.5 (GLOVE) ×1
GLOVE BIOGEL PI INDICATOR 8.5 (GLOVE) ×1
GLOVE SURG SYN 6.5 ES PF (GLOVE) ×2 IMPLANT
GLOVE SURG SYN 6.5 PF PI (GLOVE) ×1 IMPLANT
GLOVE SURG SYN 8.5  E (GLOVE) ×3
GLOVE SURG SYN 8.5 E (GLOVE) ×3 IMPLANT
GLOVE SURG SYN 8.5 PF PI (GLOVE) ×3 IMPLANT
GOWN SRG LRG LVL 4 IMPRV REINF (GOWNS) ×1 IMPLANT
GOWN SRG XL LVL 3 NONREINFORCE (GOWNS) ×1 IMPLANT
GOWN STRL NON-REIN TWL XL LVL3 (GOWNS) ×1
GOWN STRL REIN LRG LVL4 (GOWNS) ×1
GRADUATE 1200CC STRL 31836 (MISCELLANEOUS) ×2 IMPLANT
INTRAOP CADWELL SUPPLY FEE NCS (MISCELLANEOUS) ×1
INTRAOP DISP SUPPLY FEE NCS (MISCELLANEOUS) ×1
INTRAOP MONITOR FEE IMPULS NCS (MISCELLANEOUS) ×1
INTRAOP MONITOR FEE IMPULSE (MISCELLANEOUS) ×1
KIT TURNOVER KIT A (KITS) ×2 IMPLANT
MANIFOLD NEPTUNE II (INSTRUMENTS) ×2 IMPLANT
MARKER SKIN DUAL TIP RULER LAB (MISCELLANEOUS) ×4 IMPLANT
NDL SAFETY ECLIPSE 18X1.5 (NEEDLE) ×1 IMPLANT
NEEDLE HYPO 18GX1.5 SHARP (NEEDLE) ×1
NEEDLE HYPO 22GX1.5 SAFETY (NEEDLE) ×2 IMPLANT
NS IRRIG 1000ML POUR BTL (IV SOLUTION) ×2 IMPLANT
PACK LAMINECTOMY NEURO (CUSTOM PROCEDURE TRAY) ×2 IMPLANT
PAD ARMBOARD 7.5X6 YLW CONV (MISCELLANEOUS) ×2 IMPLANT
PENCIL ELECTRO HAND CTR (MISCELLANEOUS) ×2 IMPLANT
PIN CASPAR 14 (PIN) ×1 IMPLANT
PIN CASPAR 14MM (PIN) ×2
PLATE ANT CERV XTEND 2 LV 26 (Plate) ×1 IMPLANT
SCREW VAR 4.2 XD SELF DRILL 16 (Screw) ×4 IMPLANT
SOLUTION IRRIG SURGIPHOR (IV SOLUTION) ×1 IMPLANT
SPACER CERV TI 12X14X12 (Spacer) ×1 IMPLANT
SPACER CERV TI 12X14X12 0D (Spacer) ×1 IMPLANT
SPACER CERV TI 19-25 12 CORE (Spacer) ×1 IMPLANT
SPONGE KITTNER 5P (MISCELLANEOUS) ×2 IMPLANT
STAPLER SKIN PROX 35W (STAPLE) IMPLANT
SURGIFLO W/THROMBIN 8M KIT (HEMOSTASIS) ×2 IMPLANT
SUT ETHILON 3-0 FS-10 30 BLK (SUTURE) ×2
SUT V-LOC 90 ABS DVC 3-0 CL (SUTURE) ×2 IMPLANT
SUT VIC AB 3-0 SH 8-18 (SUTURE) ×3 IMPLANT
SUTURE EHLN 3-0 FS-10 30 BLK (SUTURE) IMPLANT
SYR 30ML LL (SYRINGE) ×2 IMPLANT
TAPE CLOTH 3X10 WHT NS LF (GAUZE/BANDAGES/DRESSINGS) ×6 IMPLANT
TOWEL OR 17X26 4PK STRL BLUE (TOWEL DISPOSABLE) ×6 IMPLANT
TRAY FOLEY MTR SLVR 16FR STAT (SET/KITS/TRAYS/PACK) IMPLANT
TUBING CONNECTING 10 (TUBING) ×2 IMPLANT

## 2022-04-23 NOTE — Progress Notes (Signed)
PHARMACY -  BRIEF ANTIBIOTIC NOTE   Pharmacy has received consult(s) for Cefazolin from an OR provider.  The patient's profile has been reviewed for ht/wt/allergies/indication/available labs.    One time order(s) placed for Cefazolin 2 gm  Further antibiotics/pharmacy consults should be ordered by admitting physician if indicated.                       Thank you, Otelia Sergeant, PharmD, [facility] 04/23/2022 1:05 AM

## 2022-04-23 NOTE — Anesthesia Procedure Notes (Signed)
Procedure Name: Intubation Date/Time: 04/23/2022 7:33 AM  Performed by: Jannet Mantis, CRNAPre-anesthesia Checklist: Patient identified, Patient being monitored, Timeout performed, Emergency Drugs available and Suction available Patient Re-evaluated:Patient Re-evaluated prior to induction Oxygen Delivery Method: Circle system utilized Preoxygenation: Pre-oxygenation with 100% oxygen Induction Type: IV induction Ventilation: Mask ventilation without difficulty Laryngoscope Size: 3 and McGraph Grade View: Grade I Tube type: Oral Tube size: 7.0 mm Number of attempts: 1 Airway Equipment and Method: Stylet Placement Confirmation: ETT inserted through vocal cords under direct vision, positive ETCO2 and breath sounds checked- equal and bilateral Secured at: 21 cm Tube secured with: Tape Dental Injury: Teeth and Oropharynx as per pre-operative assessment

## 2022-04-23 NOTE — H&P (Signed)
History of Present Illness: 04/23/2022 Olivia Turner presents today with ongoing symptoms.  02/15/2022 Olivia Turner is here today with a chief complaint of posterior neck pain and pain and tingling that radiates down her right arm to all 5 fingers.  She also endorses weakness in her hand grip, dropping items, and difficulty with writing.  She had a significant fall down her stairs in February 2023. Prior to this, she had no issues. Since that time, she has developed weakness in her right arm and some trouble with her balance. She has loss of dexterity in her right hand as well as change in her handwriting. Movement of her arm and neck and lifting heavy items make it worse. Meloxicam and Tylenol have made it better. She is having burning and sharpness, tingling and radiating discomfort in a C6 distribution down her right arm.  After her fall, she was unable to use her arms and legs as normal. She had shocklike symptoms down her spine at the time of her fall.  Bowel/Bladder Dysfunction: none  Conservative measures:  Physical therapy: has not participated Multimodal medical therapy including regular antiinflammatories: meloxicam, cyclobenzaprine, medrol dosepack, tizanidine, tylenol  Injections: has not received epidural steroid injections  Past Surgery: denies  Olivia Turner has clear symptoms of cervical myelopathy.  HPI from Olivia Turner, [place] on 02/01/22:  Olivia Turner is a 46 y.o otherwise health female who is here today for an ER follow up after a fall down 17 stairs about a month ago. She states that she presented to the ER with severe neck and bilateral arm pain with associated numbness and tingling. She states that her pain was so severe that she felt like her R arm was broken. She underwent multiple scans including a CT scan of her neck which did show a central disc protrusion at C5-6 and she was told that this may be Causing her neck and arm pain. She was instructed to follow-up  with neurosurgery. In the interim she has been seen by orthopedics at [facility] and underwent a cortisone injection in her neck. She was told that her symptoms were likely muscular in nature. Today she reports persistent right greater than left arm pain that radiates down the length of her arm into all 5 fingers with associated numbness and tingling. She also endorses weakness in her hand grip, dropping items, and difficulty with writing. While her symptoms have improved since her fall a month ago they are still present and affecting her quality of life. She has not undergone any additional conservative therapy such as physical therapy but she has been taking Mobic daily which has provided some relief.  The symptoms are causing a significant impact on the patient's life.   Review of Systems:  A 10 point review of systems is negative, except for the pertinent positives and negatives detailed in the HPI.  Past Medical History: Past Medical History:  Diagnosis Date   Anxiety   Past Surgical History: History reviewed. No pertinent surgical history.   Allergies  Allergen Reactions   Oxycodone Nausea And Vomiting   Penicillins Other (See Comments)    Did it involve swelling of the face/tongue/throat, SOB, or low BP? Unknown Did it involve sudden or severe rash/hives, skin peeling, or any reaction on the inside of your mouth or nose? Unknown Did you need to seek medical attention at a hospital or doctor's office? Unknown When did it last happen?      Childhood If all above answers are "NO",  may proceed with cephalosporin use.   Sulfa Antibiotics Swelling   Cephalosporins Swelling and Rash    No outpatient medications have been marked as taking for the 04/23/22 encounter [facility] Encounter).     Social History: Social History   Tobacco Use   Smoking status: Never  Passive exposure: Never   Smokeless tobacco: Never  Vaping Use   Vaping Use: Never used  Substance Use Topics    Alcohol use: Never   Drug use: Never   Family Medical History: No family history on file.  Physical Examination:  Today's Vitals   04/23/22 0630  BP: 118/64  Pulse: 78  Resp: 18  Temp: 98.4 F (36.9 C)  TempSrc: Oral  SpO2: 100%  Weight: 53.3 kg  Height: 5\' 5"  (1.651 m)  PainSc: 0-No pain   Body mass index is 19.55 kg/m.  Heart sounds normal no MRG. Chest Clear to Auscultation Bilaterally.  General: Patient is well developed, well nourished, calm, collected, and in no apparent distress. Attention to examination is appropriate.  Psychiatric: Patient is non-anxious.  Head: Pupils equal, round, and reactive to light.  ENT: Oral mucosa appears well hydrated.  Neck: Supple. Full range of motion.  Respiratory: Patient is breathing without any difficulty.  Extremities: No edema.  Vascular: Palpable dorsal pedal pulses.  Skin: On exposed skin, there are no abnormal skin lesions.  NEUROLOGICAL:   Awake, alert, oriented to person, place, and time. Speech is clear and fluent. Fund of knowledge is appropriate.   Cranial Nerves: Pupils equal round and reactive to light. Facial tone is symmetric. Facial sensation is symmetric. Shoulder shrug is symmetric. Tongue protrusion is midline. There is no pronator drift.  Strength: Side Biceps Triceps Deltoid Interossei Grip Wrist Ext. Wrist Flex.  R 4 5 5 4  4- 4 4  L 5 5 5 5 5 5 5    Side Iliopsoas Quads Hamstring PF DF EHL  R 5 5 5 5 5 5   L 5 5 5 5 5 5    Reflexes are 3+ and symmetric at the biceps, triceps, brachioradialis, patella and achilles. Hoffman's is present.  Clonus is not present. Toes are down-going.  Bilateral upper and lower extremity sensation is intact to light touch.  No evidence of dysmetria noted.  Gait is abnormal and slightly wide-based.  Moderate difficulty with tandem gait.   Medical Decision Making  Imaging: MRI C spine 02/07/22 Borderline congenitally narrow cervical spinal canal.   C2-C3: No  significant disc herniation or stenosis.   C3-C4: Shallow broad-based central disc protrusion. Uncovertebral  hypertrophy on the right. Minimal partial effacement of ventral  thecal sac (without spinal cord mass effect). Mild relative right  neural foraminal narrowing.   C4-C5: Disc bulge with right greater than left uncovertebral  hypertrophy. Mild spinal canal narrowing (without significant spinal  cord mass effect). Mild to moderate right neural foraminal  narrowing.   C5-C6: Disc bulge with endplate spurring and bilateral disc  osteophyte ridge/uncinate hypertrophy. Severe spinal canal stenosis  with moderate spinal cord flattening. T2 hyperintense signal  abnormality within the right aspect of the spinal cord at this level  compatible with myelomalacia, and possibly focal edema. Bilateral  neural foraminal narrowing (severe right, moderate left).   C6-C7: Disc bulge with endplate spurring and bilateral disc  osteophyte ridge/uncinate hypertrophy. Moderate spinal canal  stenosis with contact upon the dorsal and ventral spinal cord.  Severe bilateral neural foraminal narrowing.   C7-T1: No significant disc herniation or stenosis.   Impressions #2 and #  5 will be called to the ordering clinician or  representative by the Radiologist Assistant, and communication  documented in the PACS or [employer].   IMPRESSION:  Cervical spondylosis, as outlined and with findings most notably as  follows.   At C5-C6, there is moderate disc degeneration. Multifactorial severe  spinal canal stenosis with moderate spinal cord flattening. T2  hyperintense signal abnormality within the right aspect of the  spinal cord at this level compatible with myelomalacia, and possibly  focal edema. Bilateral neural foraminal narrowing (severe right,  moderate left).   At C6-C7, there is moderate disc degeneration and moderate  degenerative endplate edema. Multifactorial moderate spinal canal   stenosis with contact upon the dorsal and ventral spinal cord.  Severe bilateral neural foraminal narrowing.   At C4-C5, there is moderate disc degeneration. Multifactorial mild  spinal canal narrowing (without spinal cord mass effect). Mild to  moderate right neural foraminal narrowing.   5 mm lesion within the posterior aspect of the pituitary gland, as  described. This may reflect a Rathke's cleft cyst or a pituitary  adenoma containing proteinaceous content/non-acute blood products.  No further imaging evaluation is necessary. Correlate with history  of pituitary hypersecretion and consider endocrine function tests.  This recommendation follows the 2018 ACR guidelines for management  of incidental pituitary findings on CT, MRI and FDG PET.   Electronically Signed    By: Jackey Loge D.O.    On: 02/07/2022 15:18  I have personally reviewed the images and agree with the above interpretation.  Assessment and Plan: Ms. Fohl is a pleasant 46 y.o. female with new neurologic symptoms after a fall down the stairs. She has myelomalacia in her spinal cord behind the C5-6 disc. This is concerning for an incomplete spinal cord injury which happened at the time of her fall. She now has developed cervical myelopathy due to cervical spinal stenosis, cervical spondylosis with myelopathy, and suffered a positive Lhermitte sign at the time of her fall.  Given her ongoing neurologic symptoms, we will proceed with C6 corpectomy and C5-7 instrumentation.     Venetia Night MD, [facility] Department of Neurosurgery

## 2022-04-23 NOTE — Plan of Care (Signed)

## 2022-04-23 NOTE — Anesthesia Postprocedure Evaluation (Signed)
Anesthesia Post Note  Patient: Olivia Turner  Procedure(s) Performed: C6 CORPECTOMY, C5-7 INSTRUMENTATION  Patient location during evaluation: PACU Anesthesia Type: General Level of consciousness: awake and alert Pain management: pain level controlled Vital Signs Assessment: post-procedure vital signs reviewed and stable Respiratory status: spontaneous breathing, nonlabored ventilation, respiratory function stable and patient connected to nasal cannula oxygen Cardiovascular status: blood pressure returned to baseline and stable Postop Assessment: no apparent nausea or vomiting Anesthetic complications: no   No notable events documented.   Last Vitals:  Vitals:   04/23/22 1045 04/23/22 1115  BP: 135/82 133/88  Pulse: 82 76  Resp: 18 16  Temp: 36.7 C 36.8 C  SpO2: 97% 100%    Last Pain:  Vitals:   04/23/22 1045  TempSrc:   PainSc: 5                  Lenard Simmer

## 2022-04-23 NOTE — Discharge Instructions (Signed)
?Your surgeon has performed an operation on your cervical spine (neck) to relieve pressure on the spinal cord and/or nerves. This involved making an incision in the front of your neck and removing one or more of the discs that support your spine. Next, a small piece of bone, a titanium plate, and screws were used to fuse two or more of the vertebrae (bones) together. ? ?The following are instructions to help in your recovery once you have been discharged from the hospital. Even if you feel well, it is important that you follow these activity guidelines. If you do not let your neck heal properly from the surgery, you can increase the chance of return of your symptoms and other complications. ? ?* Do not take anti-inflammatory medications for 3 months after surgery (naproxen [Aleve], ibuprofen [Advil, Motrin], celecoxib [Celebrex], etc.). These medications can prevent your bones from healing properly. ? ?Activity  ?  ?No bending, lifting, or twisting (?BLT?). Avoid lifting objects heavier than 10 pounds (gallon milk jug).  Where possible, avoid household activities that involve lifting, bending, reaching, pushing, or pulling such as laundry, vacuuming, grocery shopping, and childcare. Try to arrange for help from friends and family for these activities while your back heals. ? ?Increase physical activity slowly as tolerated.  Taking short walks is encouraged, but avoid strenuous exercise. Do not jog, run, bicycle, lift weights, or participate in any other exercises unless specifically allowed by your doctor. ? ?Talk to your doctor before resuming sexual activity. ? ?You should not drive until cleared by your doctor. ? ?Until released by your doctor, you should not return to work or school.  You should rest at home and let your body heal.  ? ?You may shower three days after your surgery.  After showering, lightly dab your incision dry. Do not take a tub bath or go swimming until approved by your doctor at your  follow-up appointment. ? ?If your doctor ordered a cervical collar (neck brace) for you, you should wear it whenever you are out of bed. You may remove it when lying down or sleeping, but you should wear it at all other times. Not all neck surgeries require a cervical collar. ? ?If you smoke, we strongly recommend that you quit.  Smoking has been proven to interfere with normal bone healing and will dramatically reduce the success rate of your surgery. Please contact [facility] (800-QUIT-NOW) and use the resources at www.[facility].com for assistance in stopping smoking. ? ?Surgical Incision ?  ?If you have a dressing on your incision, you may remove it two days after your surgery. Keep your incision area clean and dry. ? ?Your incision was closed with Dermabond glue. The glue should begin to peel away within about a week. ?Diet          ? ?You may return to your usual diet. However, you may experience discomfort when swallowing in the first month after your surgery. This is normal. You may find that softer foods are more comfortable for you to swallow. Be sure to stay hydrated. ? ?When to Contact Us ? ?You may experience pain in your neck and/or pain between your shoulder blades. This is normal and should improve in the next few weeks with the help of pain medication, muscle relaxers, and rest. Some patients report that a warm compress on the back of the neck or between the shoulder blades helps. ? ?However, should you experience any of the following, contact us immediately: ?New numbness or weakness ?Pain that   is progressively getting worse, and is not relieved by your pain medication, muscle relaxers, rest, and warm compresses ?Bleeding, redness, swelling, pain, or drainage from surgical incision ?Chills or flu-like symptoms ?Fever greater than 101.0 F (38.3 C) ?Inability to eat, drink fluids, or take medications ?Problems with bowel or bladder functions ?Difficulty breathing or shortness of breath ?Warmth,  tenderness, or swelling in your calf ?Contact Information ?During office hours (Monday-Friday 9 am to 5 pm), please call your physician at [phone] and ask for Kendelyn Jean ?After hours and weekends, please call [phone] and speak with the answering service, who will contact the doctor on call.  If that fails, call the [facility] Operator at [phone] and ask for the Neurosurgery Resident On Call  ?For a life-threatening emergency, call 911 ?  ?

## 2022-04-23 NOTE — Evaluation (Signed)
Physical Therapy Evaluation Patient Details Name: Olivia Turner MRN: [id number] DOB: Jul 16, 1976 Today's Date: 04/23/2022  History of Present Illness  46 y/o female s/p anterior cervical corpectomy C6 and anterior arthrodesis C5-C7 due to posterior neck pain and RUE N&T and weakness after falling down flight of stairs in Feb 2023. She has no significant PMH.   Clinical Impression  Pt supine in bed, husband in room, agreeable to therapy. She is independent at baseline, works full time and is mom to a 47 year old. She enjoys being active; this has been limited since falling down the stairs in 12/2021. Pt was able to complete all mobility tasks independently, including bed mobility, toileting and ambulating 220ft in the hallway. PT managed IV pole. No concerns of LOB. PT encouraged pt to ambulate in hallway and take herself to the restroom as needed; communicated with RN regarding level of independence. Predict pt will make full recovery without assistance of PT. Will sign-off at this time.      Recommendations for follow up therapy are one component of a multi-disciplinary discharge planning process, led by the attending physician.  Recommendations may be updated based on patient status, additional functional criteria and insurance authorization.  Follow Up Recommendations No PT follow up    Assistance Recommended at Discharge PRN  Patient can return home with the following  Assistance with cooking/housework    Equipment Recommendations None recommended by PT  Recommendations for Other Services       Functional Status Assessment Patient has had a recent decline in their functional status and demonstrates the ability to make significant improvements in function in a reasonable and predictable amount of time.     Precautions / Restrictions Precautions Precautions: Cervical Restrictions Other Position/Activity Restrictions: No brace required      Mobility  Bed Mobility Overal  bed mobility: Independent                  Transfers Overall transfer level: Independent                      Ambulation/Gait Ambulation/Gait assistance: Independent Gait Distance (Feet): 250 Feet Assistive device: None Gait Pattern/deviations: WFL(Within Functional Limits)       General Gait Details: decreased velocity; typical stride length and gait pattern; no LOB  Stairs            Wheelchair Mobility    Modified Rankin (Stroke Patients Only)       Balance Overall balance assessment: Independent                                           Pertinent Vitals/Pain Pain Assessment Pain Assessment: 0-10 Pain Score: 7  Pain Location: cervical spine Pain Descriptors / Indicators: Aching, Discomfort Pain Intervention(s): Limited activity within patient's tolerance, Monitored during session, Repositioned    Home Living Family/patient expects to be discharged to:: Private residence Living Arrangements: Spouse/significant other;Children Available Help at Discharge: Family;Available PRN/intermittently Type of Home: House Home Access: Stairs to enter Entrance Stairs-Rails: Left Entrance Stairs-Number of Steps: 4 Alternate Level Stairs-Number of Steps: 14 Home Layout: Two level Home Equipment: None      Prior Function Prior Level of Function : Independent/Modified Independent;Working/employed;Driving;History of Falls (last six months)             Mobility Comments: Is an Production designer, theatre/television/film at [employer]. Is active, walks >10,000  steps/day. Enjoys running with her dog (has not been able to since the fall). Single fall reported resulting in this cervical injury. ADLs Comments: Independent     Hand Dominance        Extremity/Trunk Assessment   Upper Extremity Assessment Upper Extremity Assessment: Overall WFL for tasks assessed    Lower Extremity Assessment Lower Extremity Assessment: Overall WFL for tasks assessed     Cervical / Trunk Assessment Cervical / Trunk Assessment: Normal  Communication   Communication: No difficulties  Cognition Arousal/Alertness: Awake/alert Behavior During Therapy: WFL for tasks assessed/performed Overall Cognitive Status: Within Functional Limits for tasks assessed                                 General Comments: A&Ox4        General Comments      Exercises Other Exercises Other Exercises: Educated on independence with ambulation in the room and hallway.   Assessment/Plan    PT Assessment Patient does not need any further PT services  PT Problem List Decreased mobility       PT Treatment Interventions      PT Goals (Current goals can be found in the Care Plan section)  Acute Rehab PT Goals Patient Stated Goal: to get back to 100% PT Goal Formulation: With patient Time For Goal Achievement: 05/07/22 Potential to Achieve Goals: Good    Frequency       Co-evaluation               AM-PAC PT "6 Clicks" Mobility  Outcome Measure Help needed turning from your back to your side while in a flat bed without using bedrails?: None Help needed moving from lying on your back to sitting on the side of a flat bed without using bedrails?: None Help needed moving to and from a bed to a chair (including a wheelchair)?: None Help needed standing up from a chair using your arms (e.g., wheelchair or bedside chair)?: None Help needed to walk in hospital room?: None Help needed climbing 3-5 steps with a railing? : None 6 Click Score: 24    End of Session   Activity Tolerance: Patient tolerated treatment well Patient left: in bed;with family/visitor present;with call bell/phone within reach Nurse Communication: Mobility status PT Visit Diagnosis: History of falling (Z91.81)    Time: 9211-9417 PT Time Calculation (min) (ACUTE ONLY): 18 min   Charges:   PT Evaluation $PT Eval Low Complexity: 1 Low PT Treatments $Therapeutic Activity:  8-22 mins        Basilia Jumbo PT, DPT

## 2022-04-23 NOTE — Op Note (Signed)
Indications: Mrs. Bulson is a 46 yo female who presented with: Cervical spondylosis with myelopathy M47.12, Degenerative cervical spinal stenosis M48.02, Cervical myelopathy G95.9, Right arm weakness R29.898  Due to weakness and presence of myelopathy, surgery was recommended  Findings: stenosis  Preoperative Diagnosis: Cervical spondylosis with myelopathy M47.12, Degenerative cervical spinal stenosis M48.02, Cervical myelopathy G95.9, Right arm weakness R29.898 Postoperative Diagnosis: same   EBL: 100 ml IVF: see AR ml Drains: 1 placed Disposition: Extubated and Stable to PACU Complications: none  No foley catheter was placed.   Preoperative Note:   Risks of surgery discussed include: infection, bleeding, stroke, coma, death, paralysis, CSF leak, nerve/spinal cord injury, numbness, tingling, weakness, complex regional pain syndrome, recurrent stenosis and/or disc herniation, vascular injury, development of instability, neck/back pain, need for further surgery, persistent symptoms, development of deformity, and the risks of anesthesia. The patient understood these risks and agreed to proceed.  Operative Note:  PROCEDURES: 1. Anterior cervical corpectomy of C6, rmoving greater than 50% of the vertebral body at that level. 2. Anterior Arthrodesis from C5 to C7 3. Anterior cervical spine instrumentation C5 -C7 using Globus Xtend 4. Insertion of biomechanic device (Globus Fortify) as a vertebral body replacement 5. Harvesting of autograft via the same incision 6. Use of the operative microscope for aid in microdissection  PROCEDURE IN DETAIL: After obtaining informed consent, the patient taken to the operating room, placed in supine position, general anesthesia induced.  The patient had a small shoulder roll placed behind their shoulders.  After a timeout, the patient received preop antibiotics and 10 mg of IV Decadron.  The patient had a neck incision outlined, was prepped and draped  in usual sterile fashion. The incision was injected with local anesthetic.    An incision was opened, dissection taken down medial to the carotid artery and jugular vein, lateral to the trachea and esophagus.  The prevertebral fascia identified and a localizing x-ray demonstrated the correct level.  The disc was marked with the Bovie. The soft tissues and longus colli were dissected laterally from the vertebral bodies from C5 to C7 using monopolar electrocautery. Blackbelt retractors were then placed. The microscope was brought into the field for aid in microdissection.   With this complete, distractor pins were placed in the vertebral bodies of C5 and C7. The distractor was placed and the disc spaces gently distracted. The annulus at C5/6 was opened using a Bovie. Using curettes and a pituitary rongeur, the disc was removed from the endplate. Using a nerve hook, the posterior longitudinal ligament was elevated and divided. Using curettes and a 84mm Kerrison punch, the posterior longitudinal ligament was removed and the spinal cord and neuroforamina decompressed. The nerve hook was used to confirm decompression.   Then, annulus at C6/7 was opened using a Bovie, cutting away from the esophagus. Using curettes and a pituitary rongeur, the disc was removed from the endplate. Using a nerve hook, the posterior longitudinal ligament was elevated and divided. Using curettes and a 60mm Kerrison punch, the posterior longitudinal ligament was removed and the spinal cord and neuroforamina decompressed. The nerve hook was used to confirm decompression.   After disc preparation, a portion of the C6 vertebral body was removed with a Leksell rongeur and saved for used as autograft. The high speed drill was then used to finish the corpectomy. At least 70% of the vertebral body was removed in total. The site was measured to confirm adequate width. The posterior longitudinal ligament was then elevated from the inferior  disc  space and divided superiorly until the vertebral body wall was removed.  The nerve hook was then used to confirm decompression of the spinal cord. The endplates were gently decorticated for arthrodesis preparation.  After decompression, the corpectomy defect was measured and a size 19-24 mm Globus Fortify vertebral body replacement was chosen. It was filled with autograft, and then placed used flouroscopic guidance. After this was placed, a size 26 mm Globus Xtend plate was sized using flouroscopy and then placed. Two screws placed in the vertebral bodies at C5 and C7,  making sure the screws were locked.  Final radiographs were taken to confirm placement.  With everything in good position, the wound was irrigated copiously with bacitracin-containing solution and meticulous hemostasis obtained.  Wound was closed in 2 layers using interrupted inverted 3-0 Vicryl sutures.  The wound was dressed with dermabond, the head of bed at 30 degrees, taken to recovery room in stable condition.  No immediate complications were noted.  Electrophysiologic monitoring was stable throughout.  I performed the entire procedure with Manning Charity PA as an Geophysicist/field seismologist.  Venetia Night MD

## 2022-04-23 NOTE — Transfer of Care (Signed)
Immediate Anesthesia Transfer of Care Note  Patient: Olivia Turner  Procedure(s) Performed: C6 CORPECTOMY, C5-7 INSTRUMENTATION  Patient Location: PACU  Anesthesia Type:General  Level of Consciousness: awake  Airway & Oxygen Therapy: Patient Spontanous Breathing  Post-op Assessment: Report given to RN  Post vital signs: stable  Last Vitals:  Vitals Value Taken Time  BP 132/86 04/23/22 0953  Temp    Pulse 85 04/23/22 0958  Resp 20 04/23/22 0958  SpO2 100 % 04/23/22 0958  Vitals shown include unvalidated device data.  Last Pain:  Vitals:   04/23/22 0630  TempSrc: Oral  PainSc: 0-No pain      Patients Stated Pain Goal: 0 (04/23/22 0630)  Complications: No notable events documented.

## 2022-04-23 NOTE — Anesthesia Preprocedure Evaluation (Signed)
Anesthesia Evaluation  Patient identified by MRN, date of birth, ID band Patient awake    Reviewed: Allergy & Precautions, H&P , NPO status , Patient's Chart, lab work & pertinent test results, reviewed documented beta blocker date and time   Airway Mallampati: III  TM Distance: >3 FB Neck ROM: full  Mouth opening: Limited Mouth Opening  Dental  (+) Dental Advidsory Given, Teeth Intact   Pulmonary neg shortness of breath, neg COPD, neg recent URI, former smoker,    Pulmonary exam normal breath sounds clear to auscultation       Cardiovascular Exercise Tolerance: Good hypertension, (-) angina(-) Past MI and (-) Cardiac Stents Normal cardiovascular exam+ dysrhythmias (-) Valvular Problems/Murmurs Rhythm:regular Rate:Normal     Neuro/Psych PSYCHIATRIC DISORDERS Anxiety negative neurological ROS     GI/Hepatic Neg liver ROS, GERD  ,  Endo/Other  negative endocrine ROS  Renal/GU negative Renal ROS  negative genitourinary   Musculoskeletal   Abdominal   Peds  Hematology negative hematology ROS (+)   Anesthesia Other Findings Past Medical History: No date: Anemia No date: Anxiety No date: Arthritis No date: Dysrhythmia No date: GERD (gastroesophageal reflux disease) No date: Hypertension No date: Pancreatitis No date: Pneumonia   Reproductive/Obstetrics negative OB ROS                             Anesthesia Physical Anesthesia Plan  ASA: 2  Anesthesia Plan: General   Post-op Pain Management:    Induction: Intravenous  PONV Risk Score and Plan: 3 and Propofol infusion, TIVA, Ondansetron, Dexamethasone, Treatment may vary due to age or medical condition and Midazolam  Airway Management Planned: Oral ETT  Additional Equipment:   Intra-op Plan:   Post-operative Plan: Extubation in OR  Informed Consent: I have reviewed the patients History and Physical, chart, labs and discussed  the procedure including the risks, benefits and alternatives for the proposed anesthesia with the patient or authorized representative who has indicated his/her understanding and acceptance.     Dental Advisory Given  Plan Discussed with: Anesthesiologist, CRNA and Surgeon  Anesthesia Plan Comments:         Anesthesia Quick Evaluation

## 2022-04-23 NOTE — Progress Notes (Signed)
OT Screen Note  Patient Details Name: Olivia Turner MRN: [id number] DOB: [birth year]   Cancelled Treatment:    Reason Eval/Treat Not Completed: OT screened, no needs identified, will sign off. Consult received, chart reviewed. Spoke with PT. Pt independent, no skilled OT needs. Will sign off.   Arman Filter., MPH, MS, OTR/L ascom [phone] 04/23/22, 4:23 PM

## 2022-04-24 ENCOUNTER — Encounter: Payer: Self-pay | Admitting: Neurosurgery

## 2022-04-24 DIAGNOSIS — M4712 Other spondylosis with myelopathy, cervical region: Secondary | ICD-10-CM | POA: Diagnosis not present

## 2022-04-24 MED ORDER — HYDROMORPHONE HCL 2 MG PO TABS: 1.0000 mg | ORAL_TABLET | Freq: Four times a day (QID) | ORAL | 0 refills | Status: AC | PRN

## 2022-04-24 MED ORDER — METHOCARBAMOL 500 MG PO TABS: 500.0000 mg | ORAL_TABLET | Freq: Four times a day (QID) | ORAL | 0 refills | Status: DC | PRN

## 2022-04-24 NOTE — Progress Notes (Signed)
    Attending Progress Note  History: Olivia Turner is s/p C6 corpectomy with C5-7 anterior fusion.   POD1: Pain controlled overnight. Pt reporting improved sensation   Physical Exam: Vitals:   04/23/22 1947 04/24/22 0355  BP: (!) 142/87 131/88  Pulse: 69 72  Resp: 16 14  Temp: 98.2 F (36.8 C) 98.2 F (36.8 C)  SpO2: 98% 100%    AA Ox3 CNI  Strength:5/5 throughout  JP 10 since surgery.   Data:  No results for input(s): "NA", "K", "CL", "CO2", "BUN", "CREATININE", "LABGLOM", "GLUCOSE", "CALCIUM" in the last 168 hours. No results for input(s): "AST", "ALT", "ALKPHOS" in the last 168 hours.  Invalid input(s): "TBILI"   No results for input(s): "WBC", "HGB", "HCT", "PLT" in the last 168 hours. No results for input(s): "APTT", "INR" in the last 168 hours.       Other tests/results: none   Assessment/Plan:  Olivia Turner is a 46 y.o female presenting with cervical myelopathy s/p C6 corpectomy and C5-7 anterior fusion   - mobilize - pain control - DVT prophylaxis - PTOT; cleared for d/c   Manning Charity PA-C Department of Neurosurgery

## 2022-04-24 NOTE — Discharge Summary (Signed)
Physician Discharge Summary  Patient ID: Olivia Turner MRN: [id number] DOB/AGE: 1976/03/06 46 y.o.  Admit date: 04/23/2022 Discharge date: 04/24/2022  Admission Diagnoses: Cervical spondylosis with myelopathy M47.12, Degenerative cervical spinal stenosis M48.02, Cervical myelopathy G95.9, Right arm weakness R29.898    Discharge Diagnoses:  Principal Problem:   S/P cervical spinal fusion   Discharged Condition: good  Hospital Course:  Olivia Turner is a 45 y.o s/p C6 corpectomy C5-7 anterior fusion for cervical myelopathy. Her intraoperative course was uncomplicated and she was admitted overnight for drain monitoring and pain control. Her drain was removed on POD1 and she was cleared for discharge home by therapy on POD0. She was discharged with prescriptions for muscle relaxer and pain medication.  Consults:  none  Significant Diagnostic Studies: none  Treatments: surgery: as above. Please see separately dictated operative report for further details  Discharge Exam: Blood pressure 129/85, pulse 68, temperature 97.9 F (36.6 C), temperature source Oral, resp. rate 16, height 5\' 5"  (1.651 m), weight 53.3 kg, SpO2 100 %. AA Ox3 CNI   Strength:5/5 throughout  Incision c/d/i  Disposition: Discharge disposition: 01-Home or Self Care       Discharge Instructions     Incentive spirometry RT   Complete by: As directed    Remove dressing in 24 hours   Complete by: As directed       Allergies as of 04/24/2022       Reactions   Oxycodone Nausea And Vomiting   Penicillins Other (See Comments)   Did it involve swelling of the face/tongue/throat, SOB, or low BP? Unknown Did it involve sudden or severe rash/hives, skin peeling, or any reaction on the inside of your mouth or nose? Unknown Did you need to seek medical attention at a hospital or doctor's office? Unknown When did it last happen?      Childhood If all above answers are "NO", may proceed with cephalosporin  use.   Sulfa Antibiotics Swelling   Cephalosporins Swelling, Rash        Medication List     STOP taking these medications    aspirin 81 MG chewable tablet   BIOTIN PO   cyclobenzaprine 5 MG tablet Commonly known as: FLEXERIL   HYDROcodone-acetaminophen 5-325 MG tablet Commonly known as: Norco   meloxicam 15 MG tablet Commonly known as: MOBIC   PROBIOTIC DAILY PO       TAKE these medications    COLLAGEN EX Take 6,000 mg by mouth daily.   escitalopram 20 MG tablet Commonly known as: LEXAPRO Take 20 mg by mouth daily. 10 mg bid   FLAXSEED (LINSEED) PO Take 1 tablet by mouth at bedtime.   HYDROmorphone 2 MG tablet Commonly known as: DILAUDID Take 0.5 tablets (1 mg total) by mouth every 6 (six) hours as needed for up to 5 days for moderate pain.   losartan 25 MG tablet Commonly known as: COZAAR Take 25 mg by mouth daily.   methocarbamol 500 MG tablet Commonly known as: ROBAXIN Take 1 tablet (500 mg total) by mouth every 6 (six) hours as needed for muscle spasms.   Mirena (52 MG) 20 MCG/24HR IUD Generic drug: levonorgestrel 1 each by Intrauterine route.   MULTI-VITAMIN DAILY PO Take 1 tablet by mouth daily.   omeprazole 20 MG capsule Commonly known as: PRILOSEC Take 20 mg by mouth daily.   VITAMIN B-12 IJ Inject 1,200 mg as directed once a week.   vitamin B-12 1000 MCG tablet Commonly known as: CYANOCOBALAMIN Take 1,000  mcg by mouth daily.   vitamin C 1000 MG tablet Take 1,000 mg by mouth daily.        Follow-up Information     Susanne Borders, PA Follow up in 2 week(s).   Why: For post-op follow up. This appointment date and time are listed on your pre-op paperwork Contact information: [address] [place] [address] [phone]                 Signed: Susanne Borders 04/24/2022, 8:32 AM

## 2022-04-24 NOTE — Progress Notes (Signed)
Patient discharging home.Husband at bedside. IV removed. Instructions given to patient, verbalized understanding.

## 2022-05-10 ENCOUNTER — Encounter: Payer: Self-pay | Admitting: Neurosurgery

## 2022-05-10 ENCOUNTER — Ambulatory Visit (INDEPENDENT_AMBULATORY_CARE_PROVIDER_SITE_OTHER): Payer: BC Managed Care – PPO | Admitting: Neurosurgery

## 2022-05-10 VITALS — BP 159/93 | HR 90 | Temp 99.0°F

## 2022-05-10 DIAGNOSIS — M4802 Spinal stenosis, cervical region: Secondary | ICD-10-CM

## 2022-05-10 DIAGNOSIS — M4712 Other spondylosis with myelopathy, cervical region: Secondary | ICD-10-CM

## 2022-05-10 DIAGNOSIS — Z981 Arthrodesis status: Secondary | ICD-10-CM

## 2022-05-10 DIAGNOSIS — Z09 Encounter for follow-up examination after completed treatment for conditions other than malignant neoplasm: Secondary | ICD-10-CM

## 2022-05-10 MED ORDER — HYDROMORPHONE HCL 2 MG PO TABS: 2.0000 mg | ORAL_TABLET | Freq: Two times a day (BID) | ORAL | 0 refills | Status: AC | PRN

## 2022-05-10 MED ORDER — METHOCARBAMOL 500 MG PO TABS
500.0000 mg | ORAL_TABLET | Freq: Four times a day (QID) | ORAL | 0 refills | Status: AC
Start: 2022-05-10 — End: ?

## 2022-05-10 NOTE — Progress Notes (Signed)
   REFERRING PHYSICIAN:  Gayla Doss, Md [address] [place],  [place] [address]  DOS: 04/23/22 C6 corpectomy C5-7 instrumentation  HISTORY OF PRESENT ILLNESS: Olivia Turner is about 2 and half weeks status post cervical corpectomy and fusion. Overall, she is doing fairly well from her cervical surgery however she did have a fall on postop day 1 resulting in right shoulder and elbow fractures which are being treated nonoperatively by orthopedics.  This is caused an increase of her neck and interscapular pain largely at night after working.  She continues to take Robaxin and Dilaudid as needed.  She denies any incisional concerns or difficulty with swallowing.  PHYSICAL EXAMINATION:  NEUROLOGICAL:  General: In no acute distress.   Awake, alert, oriented to person, place, and time.  Pupils equal round and reactive to light.  Facial tone is symmetric.  Tongue protrusion is midline.  There is no pronator drift.  Strength: Side Biceps Triceps Deltoid Interossei Grip Wrist Ext. Wrist Flex.  R - - - 5 5 5 5   L 5 5 5 5 5 5 5    Incision c/d/I and healing well  Imaging:  No interval imaging to review  Assessment / Plan: Olivia Turner is doing well after cervical corpectomy and fusion.  While her recent fall and right arm fractures are complicating her postoperative recovery, she continues to do relatively well and has returned to work.  I have given her refills of her Dilaudid and Robaxin at her request.  We discussed activity escalation and I have advised the patient to lift up to 10 pounds until 6 weeks after surgery, then increase up to 25 pounds until 12 weeks after surgery.  After 12 weeks post-op, the patient advised to increase activity as tolerated. she will return to clinic in approximately 4 weeks with Dr. . She is aware that she will need to come early for cervical x-rays prior.  She was encouraged to call the office in the interim should she have any  questions or concerns.  She expressed understanding was in agreement with this plan.   Herbert Seta PA-C Dept of Neurosurgery

## 2022-06-13 ENCOUNTER — Other Ambulatory Visit: Payer: Self-pay

## 2022-06-13 DIAGNOSIS — Z981 Arthrodesis status: Secondary | ICD-10-CM

## 2022-06-14 ENCOUNTER — Encounter: Payer: BC Managed Care – PPO | Admitting: Neurosurgery
